# Patient Record
Sex: Female | Born: 1995 | Race: Black or African American | Hispanic: No | Marital: Single | Smoking: Former smoker
Health system: Southern US, Community
[De-identification: ages and names within clinical notes are randomized; demographics above are authoritative.]

## PROBLEM LIST (undated history)

## (undated) ENCOUNTER — Inpatient Hospital Stay (HOSPITAL_COMMUNITY): Payer: Self-pay

## (undated) DIAGNOSIS — Z789 Other specified health status: Secondary | ICD-10-CM

## (undated) HISTORY — PX: NO PAST SURGERIES: SHX2092

---
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
## 2017-04-25 ENCOUNTER — Emergency Department (HOSPITAL_COMMUNITY)
Admission: EM | Admit: 2017-04-25 | Discharge: 2017-04-25 | Disposition: A | Discharge status: Home / Self Care | Payer: Medicaid Other | Attending: Emergency Medicine | Admitting: Emergency Medicine

## 2017-04-25 ENCOUNTER — Encounter (HOSPITAL_COMMUNITY): Payer: Self-pay | Admitting: *Deleted

## 2017-04-25 DIAGNOSIS — Z87891 Personal history of nicotine dependence: Secondary | ICD-10-CM | POA: Insufficient documentation

## 2017-04-25 DIAGNOSIS — R55 Syncope and collapse: Secondary | ICD-10-CM | POA: Insufficient documentation

## 2017-04-25 DIAGNOSIS — Z79899 Other long term (current) drug therapy: Secondary | ICD-10-CM | POA: Insufficient documentation

## 2017-04-25 LAB — URINALYSIS, ROUTINE W REFLEX MICROSCOPIC
BACTERIA UA: NONE SEEN
BILIRUBIN URINE: NEGATIVE
Glucose, UA: NEGATIVE mg/dL
Hgb urine dipstick: NEGATIVE
Ketones, ur: NEGATIVE mg/dL
Nitrite: NEGATIVE
PROTEIN: NEGATIVE mg/dL
Specific Gravity, Urine: 1.024 (ref 1.005–1.030)
pH: 6 (ref 5.0–8.0)

## 2017-04-25 LAB — RAPID URINE DRUG SCREEN, HOSP PERFORMED
Amphetamines: NOT DETECTED
BARBITURATES: NOT DETECTED
Benzodiazepines: NOT DETECTED
COCAINE: NOT DETECTED
OPIATES: NOT DETECTED
Tetrahydrocannabinol: POSITIVE — AB

## 2017-04-25 LAB — PREGNANCY, URINE: Preg Test, Ur: NEGATIVE

## 2017-04-25 LAB — CBG MONITORING, ED: Glucose-Capillary: 100 mg/dL — ABNORMAL HIGH (ref 65–99)

## 2017-04-25 NOTE — ED Triage Notes (Signed)
Pt had near syncopal episode yesterday at work, this morning feels a little light headed and has pain in her rt lower abd and legs. Pt alert w/o s/s of pain or distress during triage.

## 2017-04-25 NOTE — ED Notes (Signed)
Lab notified of add on for urine.

## 2017-04-25 NOTE — ED Notes (Signed)
ED Provider at bedside. 

## 2017-04-25 NOTE — ED Provider Notes (Addendum)
[facility] Provider Note   CSN: [id number] Arrival date & time: 04/25/17  7829     History   Chief Complaint Chief Complaint  Patient presents with  . Light Headed    HPI Erica Vargas is a 21 y.o. female.  HPI Patient presents after syncopal episode yesterday. States that yesterday she was at work and registered began to feel lightheaded and passed out. States last for a couple minutes. Patient now down. No chest pain or trouble breathing. No confusion. Did not feel her heart racing and going slow. She is otherwise healthy. States she had home. This morning feels a little lightheaded and in her right upper abdomen has a little bit of pain. No fevers. No dysuria. Denies pregnancy. States she did however miss a month of her period but this menses at the beginning of this month was normal. No dysuria. No fevers or chills. No nausea vomiting or diarrhea. States she does feel little generally weak.   History reviewed. No pertinent past medical history.  There are no active problems to display for this patient.   History reviewed. No pertinent surgical history.  OB History    Gravida Para Term Preterm AB Living   0 0 0 0 0     SAB TAB Ectopic Multiple Live Births   0 0 0           Home Medications    Prior to Admission medications   Medication Sig Start Date End Date Taking? Authorizing Provider  ibuprofen (ADVIL,MOTRIN) 200 MG tablet Take 400 mg by mouth every 6 (six) hours as needed (headache, pain).   Yes [provider]  Menthol-Camphor (ICY HOT ADVANCED RELIEF) 16-11 % CREA Apply 1 application topically as needed (pain).   Yes [provider]  amoxicillin (AMOXIL) 875 MG tablet Take 1 tablet (875 mg total) by mouth 2 (two) times daily. Patient not taking: Reported on 04/25/2017 12/05/16   Elvina Sidle, MD  famotidine (PEPCID) 20 MG tablet Take 1 tablet (20 mg total) by mouth 2 (two) times daily. Patient not taking: Reported on 04/25/2017  06/27/16   Trixie Dredge, PA-C    Family History No family history on file.  Social History Social History  Substance Use Topics  . Smoking status: Former Smoker    Types: Cigars  . Smokeless tobacco: Never Used  . Alcohol use No     Allergies   Naproxen   Review of Systems Review of Systems  Constitutional: Negative for appetite change.  HENT: Negative for congestion.   Respiratory: Negative for chest tightness.   Cardiovascular: Negative for chest pain.  Gastrointestinal: Positive for abdominal pain.  Endocrine: Negative for polyuria.  Genitourinary: Positive for flank pain.  Musculoskeletal: Negative for back pain.  Neurological: Positive for syncope and light-headedness.  Hematological: Negative for adenopathy.     Physical Exam Updated Vital Signs BP 106/67   Pulse (!) 54   Temp 99 F (37.2 C) (Oral)   Resp 16   Ht  (1.651 m)   Wt 135 lb (61.2 kg)   LMP 03/31/2017 (Approximate)   SpO2 100%   BMI 22.47 kg/m   Physical Exam  Constitutional: She appears well-developed.  HENT:  Head: Atraumatic.  Neck: Neck supple.  Cardiovascular: Normal rate.   No murmur heard. Pulmonary/Chest: Effort normal.  Abdominal: Soft.  Slight tenderness to right upper quadrant/right flank area. No rash. No rebound or guarding.  Musculoskeletal: She exhibits no edema.  Neurological: She is alert.  Skin:  Skin is warm. Capillary refill takes less than 2 seconds.  Psychiatric: She has a normal mood and affect.     ED Treatments / Results  Labs (all labs ordered are listed, but only abnormal results are displayed) Labs Reviewed  URINALYSIS, ROUTINE W REFLEX MICROSCOPIC - Abnormal; Notable for the following:       Result Value   APPearance HAZY (*)    Leukocytes, UA TRACE (*)    Squamous Epithelial / LPF 6-30 (*)    All other components within normal limits  RAPID URINE DRUG SCREEN, HOSP PERFORMED - Abnormal; Notable for the following:    Tetrahydrocannabinol  POSITIVE (*)    All other components within normal limits  CBG MONITORING, ED - Abnormal; Notable for the following:    Glucose-Capillary 100 (*)    All other components within normal limits  PREGNANCY, URINE    EKG  EKG Interpretation  Date/Time:  Friday April 25 2017 10:52:14 EDT Ventricular Rate:  57 PR Interval:  154 QRS Duration: 81 QT Interval:  378 QTC Calculation: 368 R Axis:   74 Text Interpretation:  Sinus bradycardia Otherwise normal ECG Confirmed by SPECTOR MD, ZEBULON ([id number]) on 04/25/2017 3:26:37 PM       Radiology No results found.  Procedures Procedures (including critical care time)  Medications Ordered in ED Medications - No data to display   Initial Impression / Assessment and Plan / ED Course  I have reviewed the triage vital signs and the nursing notes.  Pertinent labs & imaging results that were available during my care of the patient were reviewed by me and considered in my medical decision making (see chart for details).     Patient with syncopal episode. Little lightheaded. Not orthostatic. Urine reassuring. Not pregnant. EKG reassuring.  Final Clinical Impressions(s) / ED Diagnoses   Final diagnoses:  Syncope, unspecified syncope type    New Prescriptions Discharge Medication List as of 04/25/2017 12:25 PM       Benjiman Core, MD 04/25/17 1624    Benjiman Core, MD 05/07/17 [phone]

## 2017-05-29 ENCOUNTER — Encounter (HOSPITAL_COMMUNITY): Payer: Self-pay | Admitting: Emergency Medicine

## 2017-05-29 ENCOUNTER — Ambulatory Visit (HOSPITAL_COMMUNITY)
Admission: EM | Admit: 2017-05-29 | Discharge: 2017-05-29 | Disposition: A | Discharge status: Home / Self Care | Payer: Medicaid Other | Attending: Internal Medicine | Admitting: Internal Medicine

## 2017-05-29 ENCOUNTER — Ambulatory Visit (HOSPITAL_COMMUNITY)
Admission: EM | Admit: 2017-05-29 | Discharge: 2017-05-29 | Discharge status: Against Medical Advice | Payer: Medicaid Other

## 2017-05-29 DIAGNOSIS — H60331 Swimmer's ear, right ear: Secondary | ICD-10-CM | POA: Diagnosis not present

## 2017-05-29 DIAGNOSIS — H6121 Impacted cerumen, right ear: Secondary | ICD-10-CM

## 2017-05-29 MED ORDER — NEOMYCIN-POLYMYXIN-HC 3.5-10000-1 OT SUSP: 4.0000 [drp] | Freq: Three times a day (TID) | OTIC | 0 refills | Status: DC

## 2017-05-29 NOTE — ED Provider Notes (Signed)
CSN: [id number]     Arrival date & time 05/29/17  1621 History   None    Chief Complaint  Patient presents with  . Otalgia   (Consider location/radiation/quality/duration/timing/severity/associated sxs/prior Treatment) Patient c/o right ear discomfort.   The history is provided by the patient.  Otalgia  Location:  Right Behind ear:  No abnormality Quality:  Aching Severity:  Mild Onset quality:  Sudden Duration:  1 week Timing:  Constant Chronicity:  New Relieved by:  Nothing Worsened by:  Nothing   History reviewed. No pertinent past medical history. History reviewed. No pertinent surgical history. No family history on file. Social History  Substance Use Topics  . Smoking status: Former Smoker    Types: Cigars  . Smokeless tobacco: Never Used  . Alcohol use No   OB History    Gravida Para Term Preterm AB Living   0 0 0 0 0     SAB TAB Ectopic Multiple Live Births   0 0 0         Review of Systems  Constitutional: Negative.   HENT: Positive for ear pain.   Eyes: Negative.   Respiratory: Negative.   Cardiovascular: Negative.   Gastrointestinal: Negative.   Endocrine: Negative.   Genitourinary: Negative.   Musculoskeletal: Negative.   Allergic/Immunologic: Negative.   Neurological: Negative.   Hematological: Negative.   Psychiatric/Behavioral: Negative.     Allergies  Naproxen  Home Medications   Prior to Admission medications   Medication Sig Start Date End Date Taking? Authorizing Provider  amoxicillin (AMOXIL) 875 MG tablet Take 1 tablet (875 mg total) by mouth 2 (two) times daily. Patient not taking: Reported on 04/25/2017 12/05/16   Elvina Sidle, MD  famotidine (PEPCID) 20 MG tablet Take 1 tablet (20 mg total) by mouth 2 (two) times daily. Patient not taking: Reported on 04/25/2017 06/27/16   Trixie Dredge, PA-C  ibuprofen (ADVIL,MOTRIN) 200 MG tablet Take 400 mg by mouth every 6 (six) hours as needed (headache, pain).    [provider]   Menthol-Camphor (ICY HOT ADVANCED RELIEF) 16-11 % CREA Apply 1 application topically as needed (pain).    [provider]  neomycin-polymyxin-hydrocortisone (CORTISPORIN) 3.5-10000-1 otic suspension Place 4 drops into the right ear 3 (three) times daily. 05/29/17   Deatra Canter, FNP   Meds Ordered and Administered this Visit  Medications - No data to display  BP 98/71 (BP Location: Left Arm)   Pulse (!) 55   Temp 98.8 F (37.1 C) (Oral)   Resp 16   LMP 04/28/2017   SpO2 99%  No data found.   Physical Exam  Constitutional: She appears well-developed and well-nourished.  HENT:  Head: Normocephalic and atraumatic.  Left Ear: External ear normal.  Mouth/Throat: Oropharynx is clear and moist.  Right cerumen impaction  Eyes: Conjunctivae and EOM are normal. Pupils are equal, round, and reactive to light.  Neck: Normal range of motion. Neck supple.  Cardiovascular: Normal rate, regular rhythm and normal heart sounds.   Pulmonary/Chest: Effort normal and breath sounds normal.  Abdominal: Soft. Bowel sounds are normal.  Nursing note and vitals reviewed.   Urgent Care Course     Procedures (including critical care time)  Labs Review Labs Reviewed - No data to display  Imaging Review No results found.   Visual Acuity Review  Right Eye Distance:   Left Eye Distance:   Bilateral Distance:    Right Eye Near:   Left Eye Near:    Bilateral Near:  MDM   1. Acute swimmer's ear of right side   2. Impacted cerumen of right ear    Flush right ear Cortisporin otic ear gtt's      Deatra CanterOxford, Lagretta Loseke J, FNP 05/29/17 1711

## 2017-05-29 NOTE — ED Triage Notes (Signed)
Right ear pain that started Sunday 5/27.  Reports hearing is fading in and out and hearing gone since yesterday.  Has used peroxide in ear

## 2017-07-23 ENCOUNTER — Inpatient Hospital Stay (HOSPITAL_COMMUNITY): Payer: Medicaid Other

## 2017-07-23 ENCOUNTER — Encounter (HOSPITAL_COMMUNITY): Payer: Self-pay | Admitting: *Deleted

## 2017-07-23 ENCOUNTER — Inpatient Hospital Stay (HOSPITAL_COMMUNITY)
Admission: AD | Admit: 2017-07-23 | Discharge: 2017-07-23 | Disposition: A | Discharge status: Home / Self Care | Payer: Medicaid Other | Source: Ambulatory Visit | Attending: Obstetrics & Gynecology | Admitting: Obstetrics & Gynecology

## 2017-07-23 DIAGNOSIS — O4691 Antepartum hemorrhage, unspecified, first trimester: Secondary | ICD-10-CM | POA: Insufficient documentation

## 2017-07-23 DIAGNOSIS — O98311 Other infections with a predominantly sexual mode of transmission complicating pregnancy, first trimester: Secondary | ICD-10-CM | POA: Diagnosis not present

## 2017-07-23 DIAGNOSIS — Z87891 Personal history of nicotine dependence: Secondary | ICD-10-CM | POA: Diagnosis not present

## 2017-07-23 DIAGNOSIS — A5901 Trichomonal vulvovaginitis: Secondary | ICD-10-CM | POA: Insufficient documentation

## 2017-07-23 DIAGNOSIS — O3680X0 Pregnancy with inconclusive fetal viability, not applicable or unspecified: Secondary | ICD-10-CM

## 2017-07-23 DIAGNOSIS — Z3A01 Less than 8 weeks gestation of pregnancy: Secondary | ICD-10-CM | POA: Insufficient documentation

## 2017-07-23 DIAGNOSIS — O209 Hemorrhage in early pregnancy, unspecified: Secondary | ICD-10-CM | POA: Diagnosis not present

## 2017-07-23 HISTORY — DX: Other specified health status: Z78.9

## 2017-07-23 LAB — CBC
HEMATOCRIT: 38.8 % (ref 36.0–46.0)
HEMOGLOBIN: 13.8 g/dL (ref 12.0–15.0)
MCH: 29.3 pg (ref 26.0–34.0)
MCHC: 35.6 g/dL (ref 30.0–36.0)
MCV: 82.4 fL (ref 78.0–100.0)
Platelets: 239 10*3/uL (ref 150–400)
RBC: 4.71 MIL/uL (ref 3.87–5.11)
RDW: 14.7 % (ref 11.5–15.5)
WBC: 8.4 10*3/uL (ref 4.0–10.5)

## 2017-07-23 LAB — WET PREP, GENITAL
CLUE CELLS WET PREP: NONE SEEN
Sperm: NONE SEEN
Yeast Wet Prep HPF POC: NONE SEEN

## 2017-07-23 LAB — HCG, QUANTITATIVE, PREGNANCY: HCG, BETA CHAIN, QUANT, S: 520 m[IU]/mL — AB (ref <5)

## 2017-07-23 LAB — POCT PREGNANCY, URINE: PREG TEST UR: POSITIVE — AB

## 2017-07-23 MED ORDER — METRONIDAZOLE 500 MG PO TABS
2000.0000 mg | ORAL_TABLET | Freq: Once | ORAL | Status: AC
  Administered 2017-07-23: 2000 mg via ORAL
  Filled 2017-07-23: qty 4

## 2017-07-23 NOTE — Discharge Instructions (Signed)
Ectopic Pregnancy °An ectopic pregnancy is when the fertilized egg attaches (implants) outside the uterus. Most ectopic pregnancies occur in one of the tubes where eggs travel from the ovary to the uterus (fallopian tubes), but the implanting can occur in other locations. In rare cases, ectopic pregnancies occur on the ovary, intestine, pelvis, abdomen, or cervix. In an ectopic pregnancy, the fertilized egg does not have the ability to develop into a normal, healthy baby. °A ruptured ectopic pregnancy is one in which tearing or bursting of a fallopian tube causes internal bleeding. Often, there is intense lower abdominal pain, and vaginal bleeding sometimes occurs. Having an ectopic pregnancy can be life-threatening. If this dangerous condition is not treated, it can lead to blood loss, shock, or even death. °What are the causes? °The most common cause of this condition is damage to one of the fallopian tubes. A fallopian tube may be narrowed or blocked, and that keeps the fertilized egg from reaching the uterus. °What increases the risk? °This condition is more likely to develop in women of childbearing age who have different levels of risk. The levels of risk can be divided into three categories. °High risk °· You have gone through infertility treatment. °· You have had an ectopic pregnancy before. °· You have had surgery on the fallopian tubes, or another surgical procedure, such as an abortion. °· You have had surgery to have the fallopian tubes tied (tubal ligation). °· You have problems or diseases of the fallopian tubes. °· You have been exposed to diethylstilbestrol (DES). This medicine was used until 1971, and it had effects on babies whose mothers took the medicine. °· You become pregnant while using an IUD (intrauterine device) for birth control. °Moderate risk °· You have a history of infertility. °· You have had an STI (sexually transmitted infection). °· You have a history of pelvic inflammatory  disease (PID). °· You have scarring from endometriosis. °· You have multiple sexual partners. °· You smoke. °Low risk °· You have had pelvic surgery. °· You use vaginal douches. °· You became sexually active before age 18. °What are the signs or symptoms? °Common symptoms of this condition include normal pregnancy symptoms, such as missing a period, nausea, tiredness, abdominal pain, breast tenderness, and bleeding. However, ectopic pregnancy will have additional symptoms, such as: °· Pain with intercourse. °· Irregular vaginal bleeding or spotting. °· Cramping or pain on one side or in the lower abdomen. °· Fast heartbeat, low blood pressure, and sweating. °· Passing out while having a bowel movement. ° °Symptoms of a ruptured ectopic pregnancy and internal bleeding may include: °· Sudden, severe pain in the abdomen and pelvis. °· Dizziness, weakness, light-headedness, or fainting. °· Pain in the shoulder or neck area. ° °How is this diagnosed? °This condition is diagnosed by: °· A pelvic exam to locate pain or a mass in the abdomen. °· A pregnancy test. This blood test checks for the presence as well as the specific level of pregnancy hormone in the bloodstream. °· Ultrasound. This is performed if a pregnancy test is positive. In this test, a probe is inserted into the vagina. The probe will detect a fetus, possibly in a location other than the uterus. °· Taking a sample of uterus tissue (dilation and curettage, or D&C). °· Surgery to perform a visual exam of the inside of the abdomen using a thin, lighted tube that has a tiny camera on the end (laparoscope). °· Culdocentesis. This procedure involves inserting a needle at the top   of the vagina, behind the uterus. If blood is present in this area, it may indicate that a fallopian tube is torn. ° °How is this treated? °This condition is treated with medicine or surgery. °Medicine °· An injection of a medicine (methotrexate) may be given to cause the pregnancy tissue  to be absorbed. This medicine may save your fallopian tube. It may be given if: °? The diagnosis is made early, with no signs of active bleeding. °? The fallopian tube has not ruptured. °? You are considered to be a good candidate for the medicine. °Usually, pregnancy hormone blood levels are checked after methotrexate treatment. This is to be sure that the medicine is effective. It may take 4-6 weeks for the pregnancy to be absorbed. Most pregnancies will be absorbed by 3 weeks. °Surgery °· A laparoscope may be used to remove the pregnancy tissue. °· If severe internal bleeding occurs, a larger cut (incision) may be made in the lower abdomen (laparotomy) to remove the fetus and placenta. This is done to stop the bleeding. °· Part or all of the fallopian tube may be removed (salpingectomy) along with the fetus and placenta. The fallopian tube may also be repaired during the surgery. °· In very rare circumstances, removal of the uterus (hysterectomy) may be required. °· After surgery, pregnancy hormone testing may be done to be sure that there is no pregnancy tissue left. °Whether your treatment is medicine or surgery, you may receive a Rho (D) immune globulin shot to prevent problems with any future pregnancy. This shot may be given if: °· You are Rh-negative and the baby's father is Rh-positive. °· You are Rh-negative and you do not know the Rh type of the baby's father. ° °Follow these instructions at home: °· Rest and limit your activity after the procedure for as long as told by your health care provider. °· Until your health care provider says that it is safe: °? Do not lift anything that is heavier than 10 lb (4.5 kg), or the limit that your health care provider tells you. °? Avoid physical exercise and any movement that requires effort (is strenuous). °· To help prevent constipation: °? Eat a healthy diet that includes fruits, vegetables, and whole grains. °? Drink 6-8 glasses of water per day. °Get help  right away if: °· You develop worsening pain that is not relieved by medicine. °· You have: °? A fever or chills. °? Vaginal bleeding. °? Redness and swelling at the incision site. °? Nausea and vomiting. °· You feel dizzy or weak. °· You feel light-headed or you faint. °This information is not intended to replace advice given to you by your health care provider. Make sure you discuss any questions you have with your health care provider. °Document Released: 01/23/2005 Document Revised: 08/14/2016 Document Reviewed: 07/17/2016 °Elsevier Interactive Patient Education © 2018 [employer] ° ° °Trichomoniasis °Trichomoniasis is an STI (sexually transmitted infection) that can affect both women and men. In women, the outer area of the female genitalia (vulva) and the vagina are affected. In men, the penis is mainly affected, but the prostate and other reproductive organs can also be involved. This condition can be treated with medicine. It often has no symptoms (is asymptomatic), especially in men. °What are the causes? °This condition is caused by an organism called Trichomonas vaginalis. Trichomoniasis most often spreads from person to person (is contagious) through sexual contact. °What increases the risk? °The following factors may make you more likely to develop this condition: °·   Having unprotected sexual intercourse. °· Having sexual intercourse with a partner who has trichomoniasis. °· Having multiple sexual partners. °· Having had previous trichomoniasis infections or other STIs. ° °What are the signs or symptoms? °In women, symptoms of trichomoniasis include: °· Abnormal vaginal discharge that is clear, white, gray, or yellow-green and foamy and has an unusual "fishy" odor. °· Itching and irritation of the vagina and vulva. °· Burning or pain during urination or sexual intercourse. °· Genital redness and swelling. ° °In men, symptoms of trichomoniasis include: °· Penile discharge that may be foamy or contain  pus. °· Pain in the penis. This may happen only when urinating. °· Itching or irritation inside the penis. °· Burning after urination or ejaculation. ° °How is this diagnosed? °In women, this condition may be found during a routine Pap test or physical exam. It may be found in men during a routine physical exam. Your health care provider may perform tests to help diagnose this infection, such as: °· Urine tests (men and women). °· The following in women: °? Testing the pH of the vagina. °? A vaginal swab test that checks for the Trichomonas vaginalis organism. °? Testing vaginal secretions. ° °Your health care provider may test you for other STIs, including HIV (human immunodeficiency virus). °How is this treated? °This condition is treated with medicine taken by mouth (orally), such as metronidazole or tinidazole to fight the infection. Your sexual partner(s) may also need to be tested and treated. °· If you are a woman and you plan to become pregnant or think you may be pregnant, tell your health care provider right away. Some medicines that are used to treat the infection should not be taken during pregnancy. ° °Your health care provider may recommend over-the-counter medicines or creams to help relieve itching or irritation. You may be tested for infection again 3 months after treatment. °Follow these instructions at home: °· Take and use over-the-counter and prescription medicines, including creams, only as told by your health care provider. °· Do not have sexual intercourse until one week after you finish your medicine, or until your health care provider approves. Ask your health care provider when you may resume sexual intercourse. °· (Women) Do not douche or wear tampons while you have the infection. °· Discuss your infection with your sexual partner(s). Make sure that your partner gets tested and treated, if necessary. °· Keep all follow-up visits as told by your health care provider. This is  important. °How is this prevented? °· Use condoms every time you have sex. Using condoms correctly and consistently can help protect against STIs. °· Avoid having multiple sexual partners. °· Talk with your sexual partner about any symptoms that either of you may have, as well as any history of STIs. °· Get tested for STIs and STDs (sexually transmitted diseases) before you have sex. Ask your partner to do the same. °· Do not have sexual contact if you have symptoms of trichomoniasis or another STI. °Contact a health care provider if: °· You still have symptoms after you finish your medicine. °· You develop pain in your abdomen. °· You have pain when you urinate. °· You have bleeding after sexual intercourse. °· You develop a rash. °· You feel nauseous or you vomit. °· You plan to become pregnant or think you may be pregnant. °Summary °· Trichomoniasis is an STI (sexually transmitted infection) that can affect both women and men. °· This condition often has no symptoms (is asymptomatic), especially in men. °·   You should not have sexual intercourse until one week after you finish your medicine, or until your health care provider approves. Ask your health care provider when you may resume sexual intercourse. °· Discuss your infection with your sexual partner. Make sure that your partner gets tested and treated, if necessary. °This information is not intended to replace advice given to you by your health care provider. Make sure you discuss any questions you have with your health care provider. °Document Released: 06/11/2001 Document Revised: 11/08/2016 Document Reviewed: 11/08/2016 °Elsevier Interactive Patient Education © 2017 [employer] ° °

## 2017-07-23 NOTE — MAU Note (Signed)
Urine in the lab  

## 2017-07-23 NOTE — MAU Provider Note (Signed)
History     CSN: [id number]  Arrival date and time: 07/23/17 1621   First Provider Initiated Contact with Patient 07/23/17 1901      Chief Complaint  Patient presents with  . Abdominal Pain   Erica Vargas is a 21 y.o. G1P0 at [redacted]w[redacted]d by [facility] presenting with vaginal bleeding. First bleeding episode was 07/14/2017 through 07/17/2017 but was light flow lasting only 3 days instead of her usual 7 days. She began having bright red bleeding again today. She has no pain now but had lower abdominal cramping 2 days ago. Denies nausea or breast tenderness. Last intercourse was about 1 month ago. Denies irritative vaginal discharge. Never had pelvic. First positive HPT was this morning.    Past Medical History:  Diagnosis Date  . Medical history non-contributory     Past Surgical History:  Procedure Laterality Date  . NO PAST SURGERIES      History reviewed. No pertinent family history.  Social History  Substance Use Topics  . Smoking status: Former Smoker    Types: Cigars  . Smokeless tobacco: Never Used  . Alcohol use No    Allergies:  Allergies  Allergen Reactions  . Naproxen Hives    Prescriptions Prior to Admission  Medication Sig Dispense Refill Last Dose  . amoxicillin (AMOXIL) 875 MG tablet Take 1 tablet (875 mg total) by mouth 2 (two) times daily. (Patient not taking: Reported on 04/25/2017) 20 tablet 0 Completed Course at Unknown time  . famotidine (PEPCID) 20 MG tablet Take 1 tablet (20 mg total) by mouth 2 (two) times daily. (Patient not taking: Reported on 04/25/2017) 30 tablet 0 Completed Course at Unknown time  . ibuprofen (ADVIL,MOTRIN) 200 MG tablet Take 400 mg by mouth every 6 (six) hours as needed (headache, pain).   Unknown at Unknown time  . Menthol-Camphor (ICY HOT ADVANCED RELIEF) 16-11 % CREA Apply 1 application topically as needed (pain).   Unknown at Unknown time  . neomycin-polymyxin-hydrocortisone (CORTISPORIN) 3.5-10000-1 otic suspension Place 4 drops  into the right ear 3 (three) times daily. 10 mL 0     Review of Systems  Constitutional: Negative for fatigue and fever.  Gastrointestinal: Negative for abdominal pain, constipation, diarrhea, nausea and vomiting.  Genitourinary: Positive for vaginal bleeding. Negative for dysuria, frequency, hematuria, pelvic pain, vaginal discharge and vaginal pain.  Musculoskeletal: Negative for back pain.  Psychiatric/Behavioral: The patient is nervous/anxious.    Physical Exam   Blood pressure 93/72, pulse 82, temperature 98.7 F (37.1 C), resp. rate 16, weight 126 lb (57.2 kg), last menstrual period 06/10/2017, unknown if currently breastfeeding.  Physical Exam  Nursing note and vitals reviewed. Constitutional: She is oriented to person, place, and time. She appears well-developed and well-nourished. No distress.  HENT:  Head: Normocephalic.  Eyes: Pupils are equal, round, and reactive to light.  Neck: No thyromegaly present.  Cardiovascular: Normal rate.   Respiratory: Effort normal.  GI: Soft. There is no tenderness.  Genitourinary: No vaginal discharge found.  Genitourinary Comments: Pelvic exam NEFG; vagina with small amount dark red blood swabbed from vault and cervix, minimal active bleeding; cervix closed, no lesion; uterus retroverted and nontender; no adnexal tenderness or masses.   Musculoskeletal: Normal range of motion.  Neurological: She is alert and oriented to person, place, and time.  Skin: Skin is warm and dry.  Psychiatric: She has a normal mood and affect. Her behavior is normal.    MAU Course  Procedures Results for orders placed or performed during the  hospital encounter of 07/23/17 (from the past 24 hour(s))  Pregnancy, urine POC     Status: Abnormal   Collection Time: 07/23/17  5:39 PM  Result Value Ref Range   Preg Test, Ur POSITIVE (A) NEGATIVE   Results for orders placed or performed during the hospital encounter of 07/23/17 (from the past 24 hour(s))   Pregnancy, urine POC     Status: Abnormal   Collection Time: 07/23/17  5:39 PM  Result Value Ref Range   Preg Test, Ur POSITIVE (A) NEGATIVE  Wet prep, genital     Status: Abnormal   Collection Time: 07/23/17  7:11 PM  Result Value Ref Range   Yeast Wet Prep HPF POC NONE SEEN NONE SEEN   Trich, Wet Prep PRESENT (A) NONE SEEN   Clue Cells Wet Prep HPF POC NONE SEEN NONE SEEN   WBC, Wet Prep HPF POC FEW (A) NONE SEEN   Sperm NONE SEEN   CBC     Status: None   Collection Time: 07/23/17  7:29 PM  Result Value Ref Range   WBC 8.4 4.0 - 10.5 K/uL   RBC 4.71 3.87 - 5.11 MIL/uL   Hemoglobin 13.8 12.0 - 15.0 g/dL   HCT 16.138.8 09.636.0 - 04.546.0 %   MCV 82.4 78.0 - 100.0 fL   MCH 29.3 26.0 - 34.0 pg   MCHC 35.6 30.0 - 36.0 g/dL   RDW 40.914.7 81.111.5 - 91.415.5 %   Platelets 239 150 - 400 K/uL  ABO/Rh     Status: None (Preliminary result)   Collection Time: 07/23/17  7:29 PM  Result Value Ref Range   ABO/RH(D) B POS   hCG, quantitative, pregnancy     Status: Abnormal   Collection Time: 07/23/17  7:29 PM  Result Value Ref Range   hCG, Beta Chain, Quant, S 520 (H) <5 mIU/mL   [place] Ob Comp Less 14 Wks  Result Date: 07/23/2017 CLINICAL DATA:  Vaginal bleeding with positive pregnancy test EXAM: OBSTETRIC <14 WK US AND TRANSVAGINAL OB US TECHNIQUE: Both transabdominal and transvaginal ultrasound examinations were performed for complete evaluation of the gestation as well as the maternal uterus, adnexal regions, and pelvic cul-de-sac. Transvaginal technique was performed to assess early pregnancy. COMPARISON:  None. FINDINGS: Intrauterine gestational sac: Not visualized Yolk sac:  Not visualized Embryo:  Not visualized Subchorionic hemorrhage:  None visualized. Maternal uterus/adnexae: Ovaries are within normal limits. Right ovary measures 3.1 x 1.8 x 2.9 cm. Left ovary measures 3 x 2 x 2.2 cm. Trace free fluid. IMPRESSION: 1. No intrauterine gestation is visualized. Findings compatible with pregnancy of unknown  location, differential of which includes IUP too early to be seen, recent failed pregnancy, or occult ectopic pregnancy. Suggest trending of HCG with repeat ultrasound as indicated 2. Trace free fluid in the pelvis Electronically Signed   By: Jasmine PangKim  Fujinaga M.D.   On: 07/23/2017 21:19   [place] Ob Transvaginal  Result Date: 07/23/2017 CLINICAL DATA:  Vaginal bleeding with positive pregnancy test EXAM: OBSTETRIC <14 WK US AND TRANSVAGINAL OB US TECHNIQUE: Both transabdominal and transvaginal ultrasound examinations were performed for complete evaluation of the gestation as well as the maternal uterus, adnexal regions, and pelvic cul-de-sac. Transvaginal technique was performed to assess early pregnancy. COMPARISON:  None. FINDINGS: Intrauterine gestational sac: Not visualized Yolk sac:  Not visualized Embryo:  Not visualized Subchorionic hemorrhage:  None visualized. Maternal uterus/adnexae: Ovaries are within normal limits. Right ovary measures 3.1 x 1.8 x 2.9 cm. Left ovary measures 3  x 2 x 2.2 cm. Trace free fluid. IMPRESSION: 1. No intrauterine gestation is visualized. Findings compatible with pregnancy of unknown location, differential of which includes IUP too early to be seen, recent failed pregnancy, or occult ectopic pregnancy. Suggest trending of HCG with repeat ultrasound as indicated 2. Trace free fluid in the pelvis Electronically Signed   By: Jasmine PangKim  Fujinaga M.D.   On: 07/23/2017 21:19    GC/CT sent  Care assumed by Thressa ShellerHeather Hogan, CNM at [address], Deirdre C, CNM 07/23/2017 8:53 PM  Treated in MAU with 2g flagyl PO x 1  Assessment and Plan   1. Trichomonal vaginitis   2. Vaginal bleeding in pregnancy, first trimester   3. Pregnancy, location unknown    DC home Discussed partner treatment  Comfort measures reviewed  1st Trimester precautions  Bleeding precautions Ectopic precautions RX: none  Return to MAU as needed Return to MAU Saturday morning (48 hours) for repeat HCG   Follow-up  Information    [facility] Follow up.   Why:  Saturday for repeat blood work  Contact information: [address] [id number] [place] [phone]

## 2017-07-23 NOTE — MAU Note (Signed)
Pt presents to MAU with complaints of a home pregnancy test this morning. Reports scant amount of vaginal bleeding. No pain at present. Mild cramping 2 days ago

## 2017-07-23 NOTE — Progress Notes (Addendum)
Assumed care of pt. Pt back from U/S.   Flagyl PO given per order.  2120: Discharge instructions given with pt understanding. Pt left unit via ambulatory.

## 2017-07-24 LAB — HIV ANTIBODY (ROUTINE TESTING W REFLEX): HIV SCREEN 4TH GENERATION: NONREACTIVE

## 2017-07-24 LAB — ABO/RH: ABO/RH(D): B POS

## 2017-07-25 LAB — GC/CHLAMYDIA PROBE AMP (~~LOC~~) NOT AT ARMC
Chlamydia: NEGATIVE
Neisseria Gonorrhea: NEGATIVE

## 2017-07-26 ENCOUNTER — Inpatient Hospital Stay (HOSPITAL_COMMUNITY)
Admission: AD | Admit: 2017-07-26 | Discharge: 2017-07-26 | Disposition: A | Discharge status: Home / Self Care | Payer: Medicaid Other | Source: Ambulatory Visit | Attending: Obstetrics and Gynecology | Admitting: Obstetrics and Gynecology

## 2017-07-26 DIAGNOSIS — Z3A01 Less than 8 weeks gestation of pregnancy: Secondary | ICD-10-CM | POA: Insufficient documentation

## 2017-07-26 DIAGNOSIS — R109 Unspecified abdominal pain: Secondary | ICD-10-CM

## 2017-07-26 DIAGNOSIS — Z3401 Encounter for supervision of normal first pregnancy, first trimester: Secondary | ICD-10-CM | POA: Insufficient documentation

## 2017-07-26 DIAGNOSIS — O26891 Other specified pregnancy related conditions, first trimester: Secondary | ICD-10-CM | POA: Diagnosis not present

## 2017-07-26 LAB — HCG, QUANTITATIVE, PREGNANCY: hCG, Beta Chain, Quant, S: 1022 m[IU]/mL — ABNORMAL HIGH (ref <5)

## 2017-07-26 NOTE — Discharge Instructions (Signed)
First Trimester of Pregnancy The first trimester of pregnancy is from week 1 until the end of week 13 (months 1 through 3). During this time, your baby will begin to develop inside you. At 6-8 weeks, the eyes and face are formed, and the heartbeat can be seen on ultrasound. At the end of 12 weeks, all the baby's organs are formed. Prenatal care is all the medical care you receive before the birth of your baby. Make sure you get good prenatal care and follow all of your doctor's instructions. Follow these instructions at home: Medicines  Take over-the-counter and prescription medicines only as told by your doctor. Some medicines are safe and some medicines are not safe during pregnancy.  Take a prenatal vitamin that contains at least 600 micrograms (mcg) of folic acid.  If you have trouble pooping (constipation), take medicine that will make your stool soft (stool softener) if your doctor approves. Eating and drinking  Eat regular, healthy meals.  Your doctor will tell you the amount of weight gain that is right for you.  Avoid raw meat and uncooked cheese.  If you feel sick to your stomach (nauseous) or throw up (vomit): ? Eat 4 or 5 small meals a day instead of 3 large meals. ? Try eating a few soda crackers. ? Drink liquids between meals instead of during meals.  To prevent constipation: ? Eat foods that are high in fiber, like fresh fruits and vegetables, whole grains, and beans. ? Drink enough fluids to keep your pee (urine) clear or pale yellow. Activity  Exercise only as told by your doctor. Stop exercising if you have cramps or pain in your lower belly (abdomen) or low back.  Do not exercise if it is too hot, too humid, or if you are in a place of great height (high altitude).  Try to avoid standing for long periods of time. Move your legs often if you must stand in one place for a long time.  Avoid heavy lifting.  Wear low-heeled shoes. Sit and stand up straight.  You  can have sex unless your doctor tells you not to. Relieving pain and discomfort  Wear a good support bra if your breasts are sore.  Take warm water baths (sitz baths) to soothe pain or discomfort caused by hemorrhoids. Use hemorrhoid cream if your doctor says it is okay.  Rest with your legs raised if you have leg cramps or low back pain.  If you have puffy, bulging veins (varicose veins) in your legs: ? Wear support hose or compression stockings as told by your doctor. ? Raise (elevate) your feet for 15 minutes, 3-4 times a day. ? Limit salt in your food. Prenatal care  Schedule your prenatal visits by the twelfth week of pregnancy.  Write down your questions. Take them to your prenatal visits.  Keep all your prenatal visits as told by your doctor. This is important. Safety  Wear your seat belt at all times when driving.  Make a list of emergency phone numbers. The list should include numbers for family, friends, the hospital, and police and fire departments. General instructions  Ask your doctor for a referral to a local prenatal class. Begin classes no later than at the start of month 6 of your pregnancy.  Ask for help if you need counseling or if you need help with nutrition. Your doctor can give you advice or tell you where to go for help.  Do not use hot tubs, steam rooms, or   saunas.  Do not douche or use tampons or scented sanitary pads.  Do not cross your legs for long periods of time.  Avoid all herbs and alcohol. Avoid drugs that are not approved by your doctor.  Do not use any tobacco products, including cigarettes, chewing tobacco, and electronic cigarettes. If you need help quitting, ask your doctor. You may get counseling or other support to help you quit.  Avoid cat litter boxes and soil used by cats. These carry germs that can cause birth defects in the baby and can cause a loss of your baby (miscarriage) or stillbirth.  Visit your dentist. At home, brush  your teeth with a soft toothbrush. Be gentle when you floss. Contact a doctor if:  You are dizzy.  You have mild cramps or pressure in your lower belly.  You have a nagging pain in your belly area.  You continue to feel sick to your stomach, you throw up, or you have watery poop (diarrhea).  You have a bad smelling fluid coming from your vagina.  You have pain when you pee (urinate).  You have increased puffiness (swelling) in your face, hands, legs, or ankles. Get help right away if:  You have a fever.  You are leaking fluid from your vagina.  You have spotting or bleeding from your vagina.  You have very bad belly cramping or pain.  You gain or lose weight rapidly.  You throw up blood. It may look like coffee grounds.  You are around people who have German measles, fifth disease, or chickenpox.  You have a very bad headache.  You have shortness of breath.  You have any kind of trauma, such as from a fall or a car accident. Summary  The first trimester of pregnancy is from week 1 until the end of week 13 (months 1 through 3).  To take care of yourself and your unborn baby, you will need to eat healthy meals, take medicines only if your doctor tells you to do so, and do activities that are safe for you and your baby.  Keep all follow-up visits as told by your doctor. This is important as your doctor will have to ensure that your baby is healthy and growing well. This information is not intended to replace advice given to you by your health care provider. Make sure you discuss any questions you have with your health care provider. Document Released: 06/03/2008 Document Revised: 12/24/2016 Document Reviewed: 12/24/2016 Elsevier Interactive Patient Education  2017 Elsevier Inc.  

## 2017-07-26 NOTE — MAU Provider Note (Signed)
Ms. Erica Manlyngelica Hagner  is a 21 y.o. G1P0000 at 4510w3d who presents to MAU today for follow-up quant hCG after 48 hours. She denies pain or bleeding today. She was having light bleeding at the time of her last MAU visit. She states that bleeding stopped the following morning and she has not seen any since. She has not required any pain medication. Her quant hCG was 520 on 07/23/17 and US at that time showed no IUGS, adnexa were normal.   Past Medical History:  Diagnosis Date  . Medical history non-contributory     BP 112/74 (BP Location: Right Arm)   Pulse 89   Temp 98.1 F (36.7 C) (Oral)   Resp 16   Ht 5\' 5"  (1.651 m)   Wt 123 lb (55.8 kg)   LMP 06/11/2017   BMI 20.47 kg/m   CONSTITUTIONAL: Well-developed, well-nourished female in no acute distress.  ENT: External right and left ear normal.  EYES: EOM intact, conjunctivae normal.  MUSCULOSKELETAL: Normal range of motion.  CARDIOVASCULAR: Regular heart rate RESPIRATORY: Normal effort NEUROLOGICAL: Alert and oriented to person, place, and time.  SKIN: Skin is warm and dry. No rash noted. Not diaphoretic. No erythema. No pallor. PSYCH: Normal mood and affect. Normal behavior. Normal judgment and thought content.  Results for Erica ManlyBERRY, Opel (MRN [id number]) as of 07/26/2017 10:05  Ref. Range 07/23/2017 19:29 07/23/2017 21:11 07/26/2017 08:56  HCG, Beta Chain, Quant, S Latest Ref Range: <5 mIU/mL 520 (H)  1,022 (H)     A: Pregnancy of unknown location Appropriate rise in quant hCG after 48 hours  P: Discharge home First trimester/ectopic precautions discussed Patient will return for follow-up US in 1 week. Order placed. They will call the patient with an appointment time Patient may return to MAU as needed or if her condition were to change or worsen   Marny LowensteinWenzel, Jaileen Janelle N, PA-C 07/26/2017 10:17 AM

## 2017-07-26 NOTE — MAU Note (Signed)
Pt back for follow up lab work.

## 2017-07-31 ENCOUNTER — Inpatient Hospital Stay (HOSPITAL_COMMUNITY): Payer: Medicaid Other

## 2017-07-31 ENCOUNTER — Inpatient Hospital Stay (HOSPITAL_COMMUNITY)
Admission: AD | Admit: 2017-07-31 | Discharge: 2017-07-31 | Disposition: A | Discharge status: Home / Self Care | Payer: Medicaid Other | Source: Ambulatory Visit | Attending: Obstetrics and Gynecology | Admitting: Obstetrics and Gynecology

## 2017-07-31 ENCOUNTER — Encounter (HOSPITAL_COMMUNITY): Payer: Self-pay | Admitting: *Deleted

## 2017-07-31 DIAGNOSIS — Z87891 Personal history of nicotine dependence: Secondary | ICD-10-CM | POA: Insufficient documentation

## 2017-07-31 DIAGNOSIS — Z3A01 Less than 8 weeks gestation of pregnancy: Secondary | ICD-10-CM | POA: Insufficient documentation

## 2017-07-31 DIAGNOSIS — O209 Hemorrhage in early pregnancy, unspecified: Secondary | ICD-10-CM

## 2017-07-31 DIAGNOSIS — O468X1 Other antepartum hemorrhage, first trimester: Secondary | ICD-10-CM | POA: Diagnosis not present

## 2017-07-31 DIAGNOSIS — O4691 Antepartum hemorrhage, unspecified, first trimester: Secondary | ICD-10-CM | POA: Diagnosis present

## 2017-07-31 DIAGNOSIS — O00101 Right tubal pregnancy without intrauterine pregnancy: Secondary | ICD-10-CM

## 2017-07-31 LAB — URINALYSIS, ROUTINE W REFLEX MICROSCOPIC
BILIRUBIN URINE: NEGATIVE
Bacteria, UA: NONE SEEN
GLUCOSE, UA: NEGATIVE mg/dL
KETONES UR: NEGATIVE mg/dL
Leukocytes, UA: NEGATIVE
NITRITE: NEGATIVE
PH: 6 (ref 5.0–8.0)
Protein, ur: NEGATIVE mg/dL
Specific Gravity, Urine: 1.02 (ref 1.005–1.030)

## 2017-07-31 LAB — CBC
HCT: 33 % — ABNORMAL LOW (ref 36.0–46.0)
HEMOGLOBIN: 11.8 g/dL — AB (ref 12.0–15.0)
MCH: 29.1 pg (ref 26.0–34.0)
MCHC: 35.8 g/dL (ref 30.0–36.0)
MCV: 81.5 fL (ref 78.0–100.0)
PLATELETS: 239 10*3/uL (ref 150–400)
RBC: 4.05 MIL/uL (ref 3.87–5.11)
RDW: 14.5 % (ref 11.5–15.5)
WBC: 7.6 10*3/uL (ref 4.0–10.5)

## 2017-07-31 LAB — BUN: BUN: 9 mg/dL (ref 6–20)

## 2017-07-31 LAB — HCG, QUANTITATIVE, PREGNANCY: HCG, BETA CHAIN, QUANT, S: 1171 m[IU]/mL — AB (ref <5)

## 2017-07-31 LAB — CREATININE, SERUM
Creatinine, Ser: 0.78 mg/dL (ref 0.44–1.00)
GFR calc Af Amer: >60 mL/min (ref >60)

## 2017-07-31 LAB — AST: AST: 15 U/L (ref 15–41)

## 2017-07-31 NOTE — Discharge Instructions (Signed)
Ectopic Pregnancy °An ectopic pregnancy is when the fertilized egg attaches (implants) outside the uterus. Most ectopic pregnancies occur in one of the tubes where eggs travel from the ovary to the uterus (fallopian tubes), but the implanting can occur in other locations. In rare cases, ectopic pregnancies occur on the ovary, intestine, pelvis, abdomen, or cervix. In an ectopic pregnancy, the fertilized egg does not have the ability to develop into a normal, healthy baby. °A ruptured ectopic pregnancy is one in which tearing or bursting of a fallopian tube causes internal bleeding. Often, there is intense lower abdominal pain, and vaginal bleeding sometimes occurs. Having an ectopic pregnancy can be life-threatening. If this dangerous condition is not treated, it can lead to blood loss, shock, or even death. °What are the causes? °The most common cause of this condition is damage to one of the fallopian tubes. A fallopian tube may be narrowed or blocked, and that keeps the fertilized egg from reaching the uterus. °What increases the risk? °This condition is more likely to develop in women of childbearing age who have different levels of risk. The levels of risk can be divided into three categories. °High risk °· You have gone through infertility treatment. °· You have had an ectopic pregnancy before. °· You have had surgery on the fallopian tubes, or another surgical procedure, such as an abortion. °· You have had surgery to have the fallopian tubes tied (tubal ligation). °· You have problems or diseases of the fallopian tubes. °· You have been exposed to diethylstilbestrol (DES). This medicine was used until 1971, and it had effects on babies whose mothers took the medicine. °· You become pregnant while using an IUD (intrauterine device) for birth control. °Moderate risk °· You have a history of infertility. °· You have had an STI (sexually transmitted infection). °· You have a history of pelvic inflammatory  disease (PID). °· You have scarring from endometriosis. °· You have multiple sexual partners. °· You smoke. °Low risk °· You have had pelvic surgery. °· You use vaginal douches. °· You became sexually active before age 18. °What are the signs or symptoms? °Common symptoms of this condition include normal pregnancy symptoms, such as missing a period, nausea, tiredness, abdominal pain, breast tenderness, and bleeding. However, ectopic pregnancy will have additional symptoms, such as: °· Pain with intercourse. °· Irregular vaginal bleeding or spotting. °· Cramping or pain on one side or in the lower abdomen. °· Fast heartbeat, low blood pressure, and sweating. °· Passing out while having a bowel movement. ° °Symptoms of a ruptured ectopic pregnancy and internal bleeding may include: °· Sudden, severe pain in the abdomen and pelvis. °· Dizziness, weakness, light-headedness, or fainting. °· Pain in the shoulder or neck area. ° °How is this diagnosed? °This condition is diagnosed by: °· A pelvic exam to locate pain or a mass in the abdomen. °· A pregnancy test. This blood test checks for the presence as well as the specific level of pregnancy hormone in the bloodstream. °· Ultrasound. This is performed if a pregnancy test is positive. In this test, a probe is inserted into the vagina. The probe will detect a fetus, possibly in a location other than the uterus. °· Taking a sample of uterus tissue (dilation and curettage, or D&C). °· Surgery to perform a visual exam of the inside of the abdomen using a thin, lighted tube that has a tiny camera on the end (laparoscope). °· Culdocentesis. This procedure involves inserting a needle at the top   of the vagina, behind the uterus. If blood is present in this area, it may indicate that a fallopian tube is torn. ° °How is this treated? °This condition is treated with medicine or surgery. °Medicine °· An injection of a medicine (methotrexate) may be given to cause the pregnancy tissue  to be absorbed. This medicine may save your fallopian tube. It may be given if: °? The diagnosis is made early, with no signs of active bleeding. °? The fallopian tube has not ruptured. °? You are considered to be a good candidate for the medicine. °Usually, pregnancy hormone blood levels are checked after methotrexate treatment. This is to be sure that the medicine is effective. It may take 4-6 weeks for the pregnancy to be absorbed. Most pregnancies will be absorbed by 3 weeks. °Surgery °· A laparoscope may be used to remove the pregnancy tissue. °· If severe internal bleeding occurs, a larger cut (incision) may be made in the lower abdomen (laparotomy) to remove the fetus and placenta. This is done to stop the bleeding. °· Part or all of the fallopian tube may be removed (salpingectomy) along with the fetus and placenta. The fallopian tube may also be repaired during the surgery. °· In very rare circumstances, removal of the uterus (hysterectomy) may be required. °· After surgery, pregnancy hormone testing may be done to be sure that there is no pregnancy tissue left. °Whether your treatment is medicine or surgery, you may receive a Rho (D) immune globulin shot to prevent problems with any future pregnancy. This shot may be given if: °· You are Rh-negative and the baby's father is Rh-positive. °· You are Rh-negative and you do not know the Rh type of the baby's father. ° °Follow these instructions at home: °· Rest and limit your activity after the procedure for as long as told by your health care provider. °· Until your health care provider says that it is safe: °? Do not lift anything that is heavier than 10 lb (4.5 kg), or the limit that your health care provider tells you. °? Avoid physical exercise and any movement that requires effort (is strenuous). °· To help prevent constipation: °? Eat a healthy diet that includes fruits, vegetables, and whole grains. °? Drink 6-8 glasses of water per day. °Get help  right away if: °· You develop worsening pain that is not relieved by medicine. °· You have: °? A fever or chills. °? Vaginal bleeding. °? Redness and swelling at the incision site. °? Nausea and vomiting. °· You feel dizzy or weak. °· You feel light-headed or you faint. °This information is not intended to replace advice given to you by your health care provider. Make sure you discuss any questions you have with your health care provider. °Document Released: 01/23/2005 Document Revised: 08/14/2016 Document Reviewed: 07/17/2016 °Elsevier Interactive Patient Education © 2018 Elsevier Inc. ° ° °Methotrexate Treatment for an Ectopic Pregnancy °Methotrexate is a medicine that treats a pregnancy condition in which the fetus develops outside the uterus (ectopic pregnancy) by stopping the growth of the fertilized egg. It also helps your body absorb tissue from the egg. This takes between 2 weeks and 6 weeks. Most ectopic pregnancies can be successfully treated with methotrexate if they are detected early enough. °Tell a health care provider about: °· Any allergies you have. °· All medicines you are taking, including vitamins, herbs, eye drops, creams, and over-the-counter medicines. °· Any medical conditions you have. °What are the risks? °Generally, this is a safe treatment.   However, problems can occur, including: °· Nausea. °· Vomiting. °· Diarrhea. °· Abdominal cramping. °· Mouth sores. °· Increased vaginal bleeding or spotting. °· Swelling or irritation of the lining of your lungs (pneumonitis). °· Failed treatment and continuation of the pregnancy. °· Liver damage. °· Hair loss. ° °There is still a risk of the ectopic pregnancy rupturing while using the methotrexate. °What happens before the procedure? °Before you take the medicine: °· Liver tests, kidney tests, and a complete blood test are performed. °· Blood tests are performed to measure the pregnancy hormone levels and to determine your blood type. °· If you are  Rh-negative and the father is Rh-positive or his Rh type is not known, you will be given a Rho (D) immune globulin shot. ° °What happens during the procedure? °There are two methods that your health care provider may use to give you methotrexate. °· One method involves a single dose or injection of the medicine. °· Another method involves a series of doses given through several injections. ° °What happens after the procedure? °· You may have some abdominal cramping, vaginal bleeding, and fatigue in the first few days after taking methotrexate. °· Blood tests will be taken for several weeks to check the pregnancy hormone levels. The blood tests are performed until there is no more pregnancy hormone detected in the blood. °This information is not intended to replace advice given to you by your health care provider. Make sure you discuss any questions you have with your health care provider. °Document Released: 12/10/2001 Document Revised: 05/23/2016 Document Reviewed: 10/04/2013 °Elsevier Interactive Patient Education © 2017 Elsevier Inc. ° °

## 2017-07-31 NOTE — MAU Note (Signed)
+  vaginal bleeding Bleeding more than usual Light period in nature Red No clots  +lower right abdominal pain Stabbing in nature when happens Intermittent No pain at present

## 2017-07-31 NOTE — MAU Provider Note (Signed)
History     CSN: [id number]  Arrival date and time: 07/31/17 1337  First Provider Initiated Contact with Patient 07/31/17 1421      Chief Complaint  Patient presents with  . Vaginal Bleeding   HPI Erica Vargas is a 21 y.o. G1P0000 at [redacted]w[redacted]d by LMP who presents with vaginal bleeding. Patient seen last week for vaginal bleeding. Had appropriate rising BHCG & outpatient ultrasound was ordered for viability. States vaginal bleeding has increased somewhat & is more like a period now. Not saturating pads & no clots. Also reports RLQ pain that is intermittent & stabbing. Rates pain 0/10 currently. Was treated for trichomonas during MAU visit last week; GC/Ct came back negative. Patient has not had intercourse since that visit.   OB History    Gravida Para Term Preterm AB Living   1 0 0 0 0     SAB TAB Ectopic Multiple Live Births   0 0 0          Past Medical History:  Diagnosis Date  . Medical history non-contributory     Past Surgical History:  Procedure Laterality Date  . NO PAST SURGERIES      History reviewed. No pertinent family history.  Social History  Substance Use Topics  . Smoking status: Former Smoker    Types: Cigars  . Smokeless tobacco: Never Used  . Alcohol use No    Allergies:  Allergies  Allergen Reactions  . Naproxen Hives    Prescriptions Prior to Admission  Medication Sig Dispense Refill Last Dose  . neomycin-polymyxin-hydrocortisone (CORTISPORIN) 3.5-10000-1 otic suspension Place 4 drops into the right ear 3 (three) times daily. 10 mL 0     Review of Systems  Constitutional: Negative.   Gastrointestinal: Positive for abdominal pain (none currently). Negative for constipation, diarrhea, nausea and vomiting.  Genitourinary: Positive for vaginal bleeding. Negative for vaginal discharge.   Physical Exam   Blood pressure (!) 91/49, pulse (!) 54, temperature 98 F (36.7 C), temperature source Oral, resp. rate 16, weight 124 lb 0.6 oz (56.3 kg),  last menstrual period 06/11/2017, SpO2 100 %, unknown if currently breastfeeding.  Physical Exam  Nursing note and vitals reviewed. Constitutional: She is oriented to person, place, and time. She appears well-developed and well-nourished. No distress.  HENT:  Head: Normocephalic and atraumatic.  Eyes: Conjunctivae are normal. Right eye exhibits no discharge. Left eye exhibits no discharge. No scleral icterus.  Neck: Normal range of motion.  Respiratory: Effort normal. No respiratory distress.  GI: Soft. She exhibits no distension. There is tenderness in the right lower quadrant. There is no rigidity and no guarding.  Genitourinary: No bleeding in the vagina.  Neurological: She is alert and oriented to person, place, and time.  Skin: Skin is warm and dry. She is not diaphoretic.  Psychiatric: She has a normal mood and affect. Her behavior is normal. Judgment and thought content normal.    MAU Course  Procedures Results for orders placed or performed during the hospital encounter of 07/31/17 (from the past 24 hour(s))  Urinalysis, Routine w reflex microscopic     Status: Abnormal   Collection Time: 07/31/17  1:46 PM  Result Value Ref Range   Color, Urine YELLOW YELLOW   APPearance CLEAR CLEAR   Specific Gravity, Urine 1.020 1.005 - 1.030   pH 6.0 5.0 - 8.0   Glucose, UA NEGATIVE NEGATIVE mg/dL   Hgb urine dipstick LARGE (A) NEGATIVE   Bilirubin Urine NEGATIVE NEGATIVE   Ketones, ur  NEGATIVE NEGATIVE mg/dL   Protein, ur NEGATIVE NEGATIVE mg/dL   Nitrite NEGATIVE NEGATIVE   Leukocytes, UA NEGATIVE NEGATIVE   RBC / HPF 6-30 0 - 5 RBC/hpf   WBC, UA 0-5 0 - 5 WBC/hpf   Bacteria, UA NONE SEEN NONE SEEN   Squamous Epithelial / LPF 0-5 (A) NONE SEEN   Mucous PRESENT    Hyaline Casts, UA PRESENT   CBC     Status: Abnormal   Collection Time: 07/31/17  2:38 PM  Result Value Ref Range   WBC 7.6 4.0 - 10.5 K/uL   RBC 4.05 3.87 - 5.11 MIL/uL   Hemoglobin 11.8 (L) 12.0 - 15.0 g/dL   HCT  16.133.0 (L) 09.636.0 - 46.0 %   MCV 81.5 78.0 - 100.0 fL   MCH 29.1 26.0 - 34.0 pg   MCHC 35.8 30.0 - 36.0 g/dL   RDW 04.514.5 40.911.5 - 81.115.5 %   Platelets 239 150 - 400 K/uL  hCG, quantitative, pregnancy     Status: Abnormal   Collection Time: 07/31/17  2:38 PM  Result Value Ref Range   hCG, Beta Chain, Quant, S 1,171 (H) <5 mIU/mL  AST     Status: None   Collection Time: 07/31/17  2:38 PM  Result Value Ref Range   AST 15 15 - 41 U/L  BUN     Status: None   Collection Time: 07/31/17  2:38 PM  Result Value Ref Range   BUN 9 6 - 20 mg/dL  Creatinine, serum     Status: None   Collection Time: 07/31/17  2:38 PM  Result Value Ref Range   Creatinine, Ser 0.78 0.44 - 1.00 mg/dL   GFR calc non Af Amer >60 >60 mL/min   GFR calc Af Amer >60 >60 mL/min   [place] Ob Transvaginal  Result Date: 07/31/2017 CLINICAL DATA:  Vaginal bleeding EXAM: OBSTETRIC <14 WK US AND TRANSVAGINAL OB US TECHNIQUE: Both transabdominal and transvaginal ultrasound examinations were performed for complete evaluation of the gestation as well as the maternal uterus, adnexal regions, and pelvic cul-de-sac. Transvaginal technique was performed to assess early pregnancy. COMPARISON:  None. FINDINGS: Intrauterine gestational sac: None visualized Yolk sac:  Not visualized Embryo:  Not visualized Cardiac Activity: Heart Rate:   bpm MSD:   mm    w     d CRL:    mm    w    d                  US EDC: Subchorionic hemorrhage:  None visualized. Maternal uterus/adnexae: There is a 1.7 x 1.4 x 1.2 cm masslike area adjacent to the right ovary concerning for ectopic pregnancy. No left adnexal mass or free fluid. IMPRESSION: Findings concerning for right ectopic pregnancy. Critical Value/emergent results were called by telephone at the time of interpretation on 07/31/2017 at 3:55 pm to [facility] Takeesha Isley , who verbally acknowledged these results. Electronically Signed   By: Charlett NoseKevin  Dover M.D.   On: 07/31/2017 15:56     MDM B positive VSS, NAD BHCG, CBC,  ultrasound Hemoglobin stable at 11.8 BHCG today 1171, inappropriate rise from 1022 (5 days ago) Radiologist called regarding results of ultrasound -- no IUP, no free fluid, right adnexal mass adjacent to ovary suspicious for ectopic pregnancy that was not present last week measuring 1.7x1.4x1.2  Reviewed results of labs & imaging. Offered methotrexate as treatment for ectopic pregnancy. Discussed risks of untreated ectopic pregnancy including emergent surgery, affected fertility, and death.  Patient states she would like to go home to speak with family & return tomorrow for injection.  Reviewed with Dr. Vergie LivingPickens. Patient currently stable. Will give strict return precautions.   Assessment and Plan  A: 1. Right tubal pregnancy without intrauterine pregnancy   2. Vaginal bleeding in pregnancy, first trimester    P: Discharge home Return to MAU tomorrow for labs & MTX Discussed reasons to return to MAU  Provided printed information regarding ectopic pregnancy & MTX treatment of ectopic  Erica Vargas 07/31/2017, 2:18 PM

## 2017-07-31 NOTE — MAU Note (Signed)
Urine in lab 

## 2017-08-01 LAB — CULTURE, OB URINE: CULTURE: NO GROWTH

## 2017-08-02 ENCOUNTER — Inpatient Hospital Stay (HOSPITAL_COMMUNITY)
Admission: AD | Admit: 2017-08-02 | Discharge: 2017-08-02 | Disposition: A | Discharge status: Home / Self Care | Payer: Medicaid Other | Source: Ambulatory Visit | Attending: Obstetrics and Gynecology | Admitting: Obstetrics and Gynecology

## 2017-08-02 DIAGNOSIS — O26891 Other specified pregnancy related conditions, first trimester: Secondary | ICD-10-CM | POA: Insufficient documentation

## 2017-08-02 DIAGNOSIS — Z87891 Personal history of nicotine dependence: Secondary | ICD-10-CM | POA: Insufficient documentation

## 2017-08-02 DIAGNOSIS — O00201 Right ovarian pregnancy without intrauterine pregnancy: Secondary | ICD-10-CM | POA: Diagnosis not present

## 2017-08-02 LAB — HCG, QUANTITATIVE, PREGNANCY: HCG, BETA CHAIN, QUANT, S: 1089 m[IU]/mL — AB (ref <5)

## 2017-08-02 NOTE — MAU Provider Note (Signed)
History   G1 in for repeat quant and MTX for possible ectopic preg. Pt denies any pain at present time.   CSN: [id number]  Arrival date & time 08/02/17  1444   None     Chief Complaint  Patient presents with  . Follow-up    HPI  Past Medical History:  Diagnosis Date  . Medical history non-contributory     Past Surgical History:  Procedure Laterality Date  . NO PAST SURGERIES      No family history on file.  Social History  Substance Use Topics  . Smoking status: Former Smoker    Types: Cigars  . Smokeless tobacco: Never Used  . Alcohol use No    OB History    Gravida Para Term Preterm AB Living   1 0 0 0 0     SAB TAB Ectopic Multiple Live Births   0 0 0          Review of Systems  Constitutional: Negative.   HENT: Negative.   Eyes: Negative.   Respiratory: Negative.   Cardiovascular: Negative.   Gastrointestinal: Negative.   Endocrine: Negative.   Genitourinary: Positive for vaginal bleeding.  Musculoskeletal: Negative.   Skin: Negative.   Allergic/Immunologic: Negative.   Neurological: Negative.   Hematological: Negative.   Psychiatric/Behavioral: Negative.     Allergies  Naproxen  Home Medications    BP 102/74 (BP Location: Right Arm)   Pulse (!) 103   Temp 98.3 F (36.8 C) (Oral)   Resp 16   Ht 5\' 5"  (1.651 m)   Wt 122 lb 1.9 oz (55.4 kg)   LMP 06/11/2017   SpO2 99%   BMI 20.32 kg/m   Physical Exam  Constitutional: She is oriented to person, place, and time. She appears well-developed.  HENT:  Head: Normocephalic.  Neck: Normal range of motion.  Pulmonary/Chest: Effort normal.  Musculoskeletal: Normal range of motion.  Neurological: She is alert and oriented to person, place, and time. She has normal reflexes.  Skin: Skin is warm and dry.  Psychiatric: She has a normal mood and affect. Her behavior is normal. Judgment and thought content normal.    MAU Course  Procedures (including critical care time)  Labs Reviewed   HCG, QUANTITATIVE, PREGNANCY   [place] Ob Transvaginal  Result Date: 07/31/2017 CLINICAL DATA:  Vaginal bleeding EXAM: OBSTETRIC <14 WK US AND TRANSVAGINAL OB US TECHNIQUE: Both transabdominal and transvaginal ultrasound examinations were performed for complete evaluation of the gestation as well as the maternal uterus, adnexal regions, and pelvic cul-de-sac. Transvaginal technique was performed to assess early pregnancy. COMPARISON:  None. FINDINGS: Intrauterine gestational sac: None visualized Yolk sac:  Not visualized Embryo:  Not visualized Cardiac Activity: Heart Rate:   bpm MSD:   mm    w     d CRL:    mm    w    d                  US EDC: Subchorionic hemorrhage:  None visualized. Maternal uterus/adnexae: There is a 1.7 x 1.4 x 1.2 cm masslike area adjacent to the right ovary concerning for ectopic pregnancy. No left adnexal mass or free fluid. IMPRESSION: Findings concerning for right ectopic pregnancy. Critical Value/emergent results were called by telephone at the time of interpretation on 07/31/2017 at 3:55 pm to [facility] , who verbally acknowledged these results. Electronically Signed   By: Charlett NoseKevin  Dover M.D.   On: 07/31/2017 15:56  No diagnosis found.    MDM  After lengthy discussion with pt and careful review of labs and ultrasound report and probable diagnosis of ectopic preg vs failed preg. Pt verbalized understanding but refuses MTX therapy at present time. Is will to come back to clinic Monday for repeat quant. Also verbalized understanding that if she has any abd pain she is to come back to hospital immediately. Pt lab drawn and d/c home

## 2017-08-02 NOTE — MAU Note (Signed)
Patient states she is back for follow up blood work.  Discussed previous visit that states she was due to come back from methotrexate. Patient states she does not want this.  Denies pain. +vaginal bleeding that started again today; brown in color; light period in nature

## 2017-08-04 ENCOUNTER — Ambulatory Visit: Payer: Medicaid Other

## 2017-08-04 ENCOUNTER — Telehealth: Payer: Self-pay

## 2017-08-04 ENCOUNTER — Ambulatory Visit (HOSPITAL_COMMUNITY): Admission: RE | Admit: 2017-08-04 | Payer: Medicaid Other | Source: Ambulatory Visit

## 2017-08-04 NOTE — Telephone Encounter (Signed)
Called pt in regards to missed appt for STAT beta lab.  Was unable to speak to the pt.  Family member picked up the phone and informed me that she had the pt's phone but would not be near the pt until later in the day.  I asked if the pt would be available in the am.  Family member stated that the pt should have the phone in the am.  I explained that someone from the office will call her in the am and that it is important that we reach in regards to this appt.  The family member stated understanding.

## 2017-08-05 NOTE — Telephone Encounter (Signed)
Called patient no answer was unable to leave a message at this time. Will try again later. She has no appointment scheduled in our system.

## 2017-08-07 NOTE — Telephone Encounter (Signed)
Attempted to call patient- no answer or voice mail to leave a message. 

## 2017-08-14 NOTE — Telephone Encounter (Signed)
Called patient no answer left a voice mail for patient to called us back regarding some important blood work. Patient needs to have lab work done for hormone pregnancy level.

## 2017-10-10 ENCOUNTER — Encounter: Payer: Medicaid Other | Admitting: Certified Nurse Midwife

## 2017-10-10 ENCOUNTER — Telehealth: Payer: Self-pay

## 2017-10-10 NOTE — Telephone Encounter (Signed)
Pt called to reschedule appt. This has been done

## 2017-10-27 DIAGNOSIS — Z3400 Encounter for supervision of normal first pregnancy, unspecified trimester: Secondary | ICD-10-CM | POA: Insufficient documentation

## 2017-10-28 ENCOUNTER — Encounter: Payer: Medicaid Other | Admitting: Certified Nurse Midwife

## 2017-12-26 ENCOUNTER — Encounter (HOSPITAL_COMMUNITY): Payer: Self-pay | Admitting: *Deleted

## 2017-12-26 ENCOUNTER — Inpatient Hospital Stay (HOSPITAL_COMMUNITY): Payer: Medicaid Other

## 2017-12-26 ENCOUNTER — Other Ambulatory Visit: Payer: Self-pay

## 2017-12-26 ENCOUNTER — Inpatient Hospital Stay (HOSPITAL_COMMUNITY)
Admission: AD | Admit: 2017-12-26 | Discharge: 2017-12-26 | Disposition: A | Discharge status: Home / Self Care | Payer: Medicaid Other | Source: Ambulatory Visit | Attending: Obstetrics & Gynecology | Admitting: Obstetrics & Gynecology

## 2017-12-26 DIAGNOSIS — O26893 Other specified pregnancy related conditions, third trimester: Secondary | ICD-10-CM | POA: Diagnosis present

## 2017-12-26 DIAGNOSIS — Z3201 Encounter for pregnancy test, result positive: Secondary | ICD-10-CM | POA: Diagnosis not present

## 2017-12-26 DIAGNOSIS — E860 Dehydration: Secondary | ICD-10-CM | POA: Diagnosis not present

## 2017-12-26 DIAGNOSIS — Z3A00 Weeks of gestation of pregnancy not specified: Secondary | ICD-10-CM | POA: Insufficient documentation

## 2017-12-26 DIAGNOSIS — Z87891 Personal history of nicotine dependence: Secondary | ICD-10-CM | POA: Diagnosis not present

## 2017-12-26 DIAGNOSIS — Z3400 Encounter for supervision of normal first pregnancy, unspecified trimester: Secondary | ICD-10-CM

## 2017-12-26 LAB — POCT PREGNANCY, URINE: PREG TEST UR: POSITIVE — AB

## 2017-12-26 LAB — HCG, QUANTITATIVE, PREGNANCY: HCG, BETA CHAIN, QUANT, S: 61 m[IU]/mL — AB (ref <5)

## 2017-12-26 LAB — PREGNANCY, URINE: PREG TEST UR: POSITIVE — AB

## 2017-12-26 NOTE — Progress Notes (Signed)
K. Crisoforo OxfordKooistra, CNM talking to pt regarding POC.

## 2017-12-26 NOTE — MAU Note (Signed)
Urine in lab 

## 2017-12-26 NOTE — MAU Provider Note (Signed)
Patient Erica Vargas is a 21 y.o. G1P0000 at 7541w2d  here who wants a check up because she is [redacted] weeks pregnant and has not seen a provider since October. She is here with her boyfriend.  She denies abdominal pain, bleeding, discharge, NV, dysuria.  She was followed for a pregnancy of unknown location in August but did not keep her appointments. At her last appointment on August 4 she was told she needed repeat lab work for possible ectopic but she did not follow up. See notes from Aug and Oct 2018.  History     CSN: [id number]  Arrival date and time: 12/26/17 [id number]   None     Chief Complaint  Patient presents with  . Dehydrated   HPI Patient is here for a check-up because she says she is [redacted] weeks pregnant. Patient claims to have had a prenatal visit in October at Dr. Thomes LollingHarpers office, where a fetal heart rate was auscultated at 145 bpm.  No visit documenting this appointment can be found in our system, and patient denies using another name.  She denies vaginal bleeding, cramping, NV/ abdominal pain, abnormal discharge.  OB History    Gravida Para Term Preterm AB Living   1 0 0 0 0     SAB TAB Ectopic Multiple Live Births   0 0 0          Past Medical History:  Diagnosis Date  . Medical history non-contributory     Past Surgical History:  Procedure Laterality Date  . NO PAST SURGERIES      Family History  Problem Relation Age of Onset  . Sickle cell anemia Mother     Social History   Tobacco Use  . Smoking status: Former Smoker    Types: Cigars  . Smokeless tobacco: Never Used  Substance Use Topics  . Alcohol use: No  . Drug use: No    Allergies:  Allergies  Allergen Reactions  . Naproxen Hives    Medications Prior to Admission  Medication Sig Dispense Refill Last Dose  . neomycin-polymyxin-hydrocortisone (CORTISPORIN) 3.5-10000-1 otic suspension Place 4 drops into the right ear 3 (three) times daily. 10 mL 0     Review of Systems  HENT: Negative.    Respiratory: Negative.   Cardiovascular: Negative.   Gastrointestinal: Negative.   Genitourinary: Negative.   Musculoskeletal: Negative.   Neurological: Negative.   Psychiatric/Behavioral: Negative.    Physical Exam   Blood pressure 106/83, pulse (!) 108, temperature 98.3 F (36.8 C), temperature source Oral, resp. rate 20, height 5\' 5"  (1.651 m), weight 52.2 kg (115 lb), last menstrual period 06/11/2017, SpO2 98 %, unknown if currently breastfeeding.  Physical Exam  Constitutional: She is oriented to person, place, and time. She appears well-developed and well-nourished.  HENT:  Head: Normocephalic.  Neck: Normal range of motion.  GI: Soft.  Musculoskeletal: Normal range of motion.  Neurological: She is alert and oriented to person, place, and time.  Skin: Skin is warm and dry.  Psychiatric: She has a normal mood and affect.    MAU Course  Procedures  MDM Stat bedside US ordered when unable to detect FHR with monitors. Dr.Davis at the bedside immediately along with radiology. Beside US shows a non-gravid uterus.  -beta hcg is 61; UPT is faintly positive.   Plan of care discussed with Erin FullingHarraway Smith. It is possible that the beta hcg is elevated from a new pregnancy, Patient to return to clinic in one week for repeat bchg.  At that point, determine if patient needs another US or repeat bhcg.     PAtient denies having seen any other health care providers or having had a termination. Says she has had no bleeding or any signs of a miscarriage. She is very confused by the fact that she is not [redacted] weeks pregnant. She is unable to say why she hasn't come for any prenatal visits, other than to say "I had my prenatal vitamins so I just didn't come in". At times ignored this CNM's attempts to discuss the situation  with her and instead focused on texting on her cell phone.  Assessment and Plan   1. Pregnancy test positive   2. Supervision of normal first pregnancy, antepartum    2..  Discussed with patient that we cannot rule out ectopic pregnancy vs. New pregnancy vs. Past pregnancy.  3. Patient will return to clinic in one week for a repeat bchg (patient should wait for results). At that point, it may be necessary to order a follow-up outpatient US.  4. Strict ectopic precautions given.   Charlesetta GaribaldiKathryn Lorraine Ramiro Pangilinan 12/26/2017, 1:53 PM

## 2017-12-26 NOTE — Discharge Instructions (Signed)
Ectopic Pregnancy  An ectopic pregnancy happens when a fertilized egg grows outside the uterus. A pregnancy cannot live outside of the uterus. This problem often happens in the fallopian tube. It is often caused by damage to the fallopian tube.  If this problem is found early, you may be treated with medicine. If your tube tears or bursts open (ruptures), you will bleed inside. This is an emergency. You will need surgery. Get help right away.  What are the signs or symptoms?  You may have normal pregnancy symptoms at first. These include:  · Missing your period.  · Feeling sick to your stomach (nauseous).  · Being tired.  · Having tender breasts.    Then, you may start to have symptoms that are not normal. These include:  · Pain with sex (intercourse).  · Bleeding from the vagina. This includes light bleeding (spotting).  · Belly (abdomen) or lower belly cramping or pain. This may be felt on one side.  · A fast heartbeat (pulse).  · Passing out (fainting) after going poop (bowel movement).    If your tube tears, you may have symptoms such as:  · Really bad pain in the belly or lower belly. This happens suddenly.  · Dizziness.  · Passing out.  · Shoulder pain.    Get help right away if:  You have any of these symptoms. This is an emergency.  This information is not intended to replace advice given to you by your health care provider. Make sure you discuss any questions you have with your health care provider.  Document Released: 03/14/2009 Document Revised: 05/23/2016 Document Reviewed: 07/28/2013  Elsevier Interactive Patient Education © 2017 Elsevier Inc[employer]        First Trimester of Pregnancy  The first trimester of pregnancy is from week 1 until the end of week 13 (months 1 through 3). During this time, your baby will begin to develop inside you. At 6-8 weeks, the eyes and face are formed, and the heartbeat can be seen on ultrasound. At the end of 12 weeks, all the baby's organs are formed. Prenatal care is all the  medical care you receive before the birth of your baby. Make sure you get good prenatal care and follow all of your doctor's instructions.  Follow these instructions at home:  Medicines  · Take over-the-counter and prescription medicines only as told by your doctor. Some medicines are safe and some medicines are not safe during pregnancy.  · Take a prenatal vitamin that contains at least 600 micrograms (mcg) of folic acid.  · If you have trouble pooping (constipation), take medicine that will make your stool soft (stool softener) if your doctor approves.  Eating and drinking  · Eat regular, healthy meals.  · Your doctor will tell you the amount of weight gain that is right for you.  · Avoid raw meat and uncooked cheese.  · If you feel sick to your stomach (nauseous) or throw up (vomit):  ? Eat 4 or 5 small meals a day instead of 3 large meals.  ? Try eating a few soda crackers.  ? Drink liquids between meals instead of during meals.  · To prevent constipation:  ? Eat foods that are high in fiber, like fresh fruits and vegetables, whole grains, and beans.  ? Drink enough fluids to keep your pee (urine) clear or pale yellow.  Activity  · Exercise only as told by your doctor. Stop exercising if you have cramps or pain   in your lower belly (abdomen) or low back.  · Do not exercise if it is too hot, too humid, or if you are in a place of great height (high altitude).  · Try to avoid standing for long periods of time. Move your legs often if you must stand in one place for a long time.  · Avoid heavy lifting.  · Wear low-heeled shoes. Sit and stand up straight.  · You can have sex unless your doctor tells you not to.  Relieving pain and discomfort  · Wear a good support bra if your breasts are sore.  · Take warm water baths (sitz baths) to soothe pain or discomfort caused by hemorrhoids. Use hemorrhoid cream if your doctor says it is okay.  · Rest with your legs raised if you have leg cramps or low back pain.  · If you  have puffy, bulging veins (varicose veins) in your legs:  ? Wear support hose or compression stockings as told by your doctor.  ? Raise (elevate) your feet for 15 minutes, 3-4 times a day.  ? Limit salt in your food.  Prenatal care  · Schedule your prenatal visits by the twelfth week of pregnancy.  · Write down your questions. Take them to your prenatal visits.  · Keep all your prenatal visits as told by your doctor. This is important.  Safety  · Wear your seat belt at all times when driving.  · Make a list of emergency phone numbers. The list should include numbers for family, friends, the hospital, and police and fire departments.  General instructions  · Ask your doctor for a referral to a local prenatal class. Begin classes no later than at the start of month 6 of your pregnancy.  · Ask for help if you need counseling or if you need help with nutrition. Your doctor can give you advice or tell you where to go for help.  · Do not use hot tubs, steam rooms, or saunas.  · Do not douche or use tampons or scented sanitary pads.  · Do not cross your legs for long periods of time.  · Avoid all herbs and alcohol. Avoid drugs that are not approved by your doctor.  · Do not use any tobacco products, including cigarettes, chewing tobacco, and electronic cigarettes. If you need help quitting, ask your doctor. You may get counseling or other support to help you quit.  · Avoid cat litter boxes and soil used by cats. These carry germs that can cause birth defects in the baby and can cause a loss of your baby (miscarriage) or stillbirth.  · Visit your dentist. At home, brush your teeth with a soft toothbrush. Be gentle when you floss.  Contact a doctor if:  · You are dizzy.  · You have mild cramps or pressure in your lower belly.  · You have a nagging pain in your belly area.  · You continue to feel sick to your stomach, you throw up, or you have watery poop (diarrhea).  · You have a bad smelling fluid coming from your  vagina.  · You have pain when you pee (urinate).  · You have increased puffiness (swelling) in your face, hands, legs, or ankles.  Get help right away if:  · You have a fever.  · You are leaking fluid from your vagina.  · You have spotting or bleeding from your vagina.  · You have very bad belly cramping or pain.  · You   gain or lose weight rapidly.  · You throw up blood. It may look like coffee grounds.  · You are around people who have German measles, fifth disease, or chickenpox.  · You have a very bad headache.  · You have shortness of breath.  · You have any kind of trauma, such as from a fall or a car accident.  Summary  · The first trimester of pregnancy is from week 1 until the end of week 13 (months 1 through 3).  · To take care of yourself and your unborn baby, you will need to eat healthy meals, take medicines only if your doctor tells you to do so, and do activities that are safe for you and your baby.  · Keep all follow-up visits as told by your doctor. This is important as your doctor will have to ensure that your baby is healthy and growing well.  This information is not intended to replace advice given to you by your health care provider. Make sure you discuss any questions you have with your health care provider.  Document Released: 06/03/2008 Document Revised: 12/24/2016 Document Reviewed: 12/24/2016  Elsevier Interactive Patient Education © 2017 Elsevier Inc.

## 2017-12-26 NOTE — MAU Note (Signed)
Pt presents for checkup, [facility]  Reports she's feels dehydrated, lightheaded and dizzy.  Denies VB or LOF.  Reports +FM.

## 2017-12-26 NOTE — Progress Notes (Signed)
Attempting to apply EFM.  Unable to decipher if FHT heard versus maternal pulse.  Pulse ox applied.  Georgiana ShoreL. Weston, RN, charge nurse asked to assist in finding FHT.  Initially used U/S transducer, unable to capture FHT, then attempted to doppler FHT.  Again, unable to determine if auscultating FHT or maternal pulse. Paulina FusiK. Kooistra, CNM notified and requested @ bedside for U/S.  K. Kooistra @ bedside followed by Dr. Earlene Plateravis.  Bedside ultrasound performed by radiology and no fetus seen in uterus.   Pt asked when was last prenatal visit, reports was seen in October @ [facility]. Reports FHT dopplered @ 147 bpm.

## 2018-01-02 ENCOUNTER — Other Ambulatory Visit: Payer: Medicaid Other

## 2018-01-02 ENCOUNTER — Ambulatory Visit: Payer: Medicaid Other

## 2018-01-03 ENCOUNTER — Other Ambulatory Visit: Payer: Self-pay

## 2018-01-03 ENCOUNTER — Inpatient Hospital Stay (HOSPITAL_COMMUNITY)
Admission: AD | Admit: 2018-01-03 | Discharge: 2018-01-03 | Disposition: A | Discharge status: Home / Self Care | Payer: Medicaid Other | Source: Ambulatory Visit | Attending: Obstetrics and Gynecology | Admitting: Obstetrics and Gynecology

## 2018-01-03 ENCOUNTER — Encounter (HOSPITAL_COMMUNITY): Payer: Self-pay | Admitting: *Deleted

## 2018-01-03 DIAGNOSIS — R42 Dizziness and giddiness: Secondary | ICD-10-CM

## 2018-01-03 DIAGNOSIS — Z87891 Personal history of nicotine dependence: Secondary | ICD-10-CM | POA: Diagnosis not present

## 2018-01-03 DIAGNOSIS — Z79899 Other long term (current) drug therapy: Secondary | ICD-10-CM | POA: Insufficient documentation

## 2018-01-03 DIAGNOSIS — R197 Diarrhea, unspecified: Secondary | ICD-10-CM | POA: Diagnosis not present

## 2018-01-03 DIAGNOSIS — O039 Complete or unspecified spontaneous abortion without complication: Secondary | ICD-10-CM | POA: Diagnosis not present

## 2018-01-03 DIAGNOSIS — Z886 Allergy status to analgesic agent status: Secondary | ICD-10-CM | POA: Diagnosis not present

## 2018-01-03 DIAGNOSIS — Z832 Family history of diseases of the blood and blood-forming organs and certain disorders involving the immune mechanism: Secondary | ICD-10-CM | POA: Diagnosis not present

## 2018-01-03 DIAGNOSIS — G4700 Insomnia, unspecified: Secondary | ICD-10-CM | POA: Diagnosis not present

## 2018-01-03 LAB — CBC
HCT: 34.6 % — ABNORMAL LOW (ref 36.0–46.0)
HEMOGLOBIN: 12.1 g/dL (ref 12.0–15.0)
MCH: 28.7 pg (ref 26.0–34.0)
MCHC: 35 g/dL (ref 30.0–36.0)
MCV: 82 fL (ref 78.0–100.0)
Platelets: 235 10*3/uL (ref 150–400)
RBC: 4.22 MIL/uL (ref 3.87–5.11)
RDW: 14.6 % (ref 11.5–15.5)
WBC: 6.9 10*3/uL (ref 4.0–10.5)

## 2018-01-03 LAB — HCG, QUANTITATIVE, PREGNANCY: HCG, BETA CHAIN, QUANT, S: 19 m[IU]/mL — AB (ref <5)

## 2018-01-03 LAB — URINALYSIS, ROUTINE W REFLEX MICROSCOPIC
Bilirubin Urine: NEGATIVE
GLUCOSE, UA: NEGATIVE mg/dL
HGB URINE DIPSTICK: NEGATIVE
Ketones, ur: 5 mg/dL — AB
Leukocytes, UA: NEGATIVE
NITRITE: NEGATIVE
PROTEIN: 100 mg/dL — AB
SPECIFIC GRAVITY, URINE: 1.032 — AB (ref 1.005–1.030)
pH: 5 (ref 5.0–8.0)

## 2018-01-03 LAB — POCT PREGNANCY, URINE: Preg Test, Ur: NEGATIVE

## 2018-01-03 NOTE — MAU Provider Note (Signed)
History     CSN: [id number]  Arrival date and time: 01/03/18 2137   First Provider Initiated Contact with Patient 01/03/18 2246     Chief Complaint  Patient presents with  . Dizziness   HPI Erica Vargas is a 22 y.o. G2P0010 who presents with multiple complaints. She states she has felt light headed for a week. She reports 3 episodes of diarrhea today, last time at 6pm. She states she has been eating normal meals and drinking water. She denies any exposure to anyone with similar symptoms. She also reports insomnia for the last week. She was seen in MAU on 12/28 with a quant of 61 and did not follow up after discharge. Patient states her friend had to sign in and said she wanted to be seen too.   OB History    Gravida Para Term Preterm AB Living   2 0 0 0 1     SAB TAB Ectopic Multiple Live Births   1 0 0          Past Medical History:  Diagnosis Date  . Medical history non-contributory     Past Surgical History:  Procedure Laterality Date  . NO PAST SURGERIES      Family History  Problem Relation Age of Onset  . Sickle cell anemia Mother     Social History   Tobacco Use  . Smoking status: Former Smoker    Types: Cigars  . Smokeless tobacco: Never Used  Substance Use Topics  . Alcohol use: No  . Drug use: No    Allergies:  Allergies  Allergen Reactions  . Naproxen Hives    Medications Prior to Admission  Medication Sig Dispense Refill Last Dose  . neomycin-polymyxin-hydrocortisone (CORTISPORIN) 3.5-10000-1 otic suspension Place 4 drops into the right ear 3 (three) times daily. 10 mL 0     Review of Systems  Constitutional: Negative.  Negative for fatigue and fever.  HENT: Negative.   Respiratory: Negative.  Negative for shortness of breath.   Cardiovascular: Negative.  Negative for chest pain.  Gastrointestinal: Positive for diarrhea. Negative for abdominal pain, constipation, nausea and vomiting.  Genitourinary: Negative.  Negative for dysuria.   Neurological: Positive for light-headedness. Negative for dizziness and headaches.  Psychiatric/Behavioral: Positive for sleep disturbance.   Physical Exam   Blood pressure 102/68, pulse 62, temperature 98.5 F (36.9 C), resp. rate 18, height 5\' 5"  (1.651 m), weight 120 lb (54.4 kg), last menstrual period 06/11/2017, unknown if currently breastfeeding.  Physical Exam  Nursing note and vitals reviewed. Constitutional: She is oriented to person, place, and time. She appears well-developed and well-nourished. No distress.  HENT:  Head: Normocephalic.  Eyes: Pupils are equal, round, and reactive to light.  Cardiovascular: Normal rate, regular rhythm and normal heart sounds.  Respiratory: Effort normal and breath sounds normal. No respiratory distress.  GI: Soft. Bowel sounds are normal. She exhibits no distension. There is no tenderness.  Neurological: She is alert and oriented to person, place, and time.  Skin: Skin is warm and dry.  Psychiatric: She has a normal mood and affect. Her behavior is normal. Judgment and thought content normal.   Orthostatic VS for the past 24 hrs (Last 3 readings):  BP- Lying Pulse- Lying BP- Sitting Pulse- Sitting BP- Standing at 0 minutes Pulse- Standing at 0 minutes BP- Standing at 3 minutes Pulse- Standing at 3 minutes  01/03/18 2319 110/64 60 109/68 53 110/60 69 101/71 81    MAU Course  Procedures Results  for orders placed or performed during the hospital encounter of 01/03/18 (from the past 24 hour(s))  CBC     Status: Abnormal   Collection Time: 01/03/18  9:42 PM  Result Value Ref Range   WBC 6.9 4.0 - 10.5 K/uL   RBC 4.22 3.87 - 5.11 MIL/uL   Hemoglobin 12.1 12.0 - 15.0 g/dL   HCT 16.1 (L) 09.6 - 04.5 %   MCV 82.0 78.0 - 100.0 fL   MCH 28.7 26.0 - 34.0 pg   MCHC 35.0 30.0 - 36.0 g/dL   RDW 40.9 81.1 - 91.4 %   Platelets 235 150 - 400 K/uL  hCG, quantitative, pregnancy     Status: Abnormal   Collection Time: 01/03/18  9:42 PM  Result  Value Ref Range   hCG, Beta Chain, Quant, S 19 (H) <5 mIU/mL  Urinalysis, Routine w reflex microscopic     Status: Abnormal   Collection Time: 01/03/18 10:05 PM  Result Value Ref Range   Color, Urine YELLOW YELLOW   APPearance HAZY (A) CLEAR   Specific Gravity, Urine 1.032 (H) 1.005 - 1.030   pH 5.0 5.0 - 8.0   Glucose, UA NEGATIVE NEGATIVE mg/dL   Hgb urine dipstick NEGATIVE NEGATIVE   Bilirubin Urine NEGATIVE NEGATIVE   Ketones, ur 5 (A) NEGATIVE mg/dL   Protein, ur 782 (A) NEGATIVE mg/dL   Nitrite NEGATIVE NEGATIVE   Leukocytes, UA NEGATIVE NEGATIVE   RBC / HPF 0-5 0 - 5 RBC/hpf   WBC, UA 0-5 0 - 5 WBC/hpf   Bacteria, UA RARE (A) NONE SEEN   Squamous Epithelial / LPF 0-5 (A) NONE SEEN   Mucus PRESENT   Pregnancy, urine POC     Status: None   Collection Time: 01/03/18 10:20 PM  Result Value Ref Range   Preg Test, Ur NEGATIVE NEGATIVE    MDM UA, UPT CBC Orthostatic vital signs No evidence of any acute processes. VSS stable, patient on cell phone during conversation. After a long discussion with patient, patient states she is here to recheck her hormone level because she did not keep her appointment in clinic and feels stressed about the situation. Will repeat HCG tonight.   Drop in HCG likely indicative of miscarriage. Will have patient follow up in the clinic in a week for repeat HCG and in 2 weeks with provider.  Assessment and Plan   1. Miscarriage   2. Lightheadedness    -Discharge home in stable condition -OTC treatments for sleep reviewed with patient -Patient [facility] for repeat HCG -Patient may return to MAU as needed or if her condition were to change or worsen  Rolm Bookbinder CNM 01/03/2018, 11:31 PM

## 2018-01-03 NOTE — MAU Note (Signed)
I was here with a friend and feeling lightheaded. Some loose stools today x 3. No vag bleeding or discharge. No appetite. No vomiting.

## 2018-01-09 ENCOUNTER — Ambulatory Visit: Payer: Medicaid Other

## 2018-01-09 ENCOUNTER — Telehealth: Payer: Self-pay | Admitting: General Practice

## 2018-01-09 DIAGNOSIS — O039 Complete or unspecified spontaneous abortion without complication: Secondary | ICD-10-CM

## 2018-01-09 NOTE — Telephone Encounter (Signed)
Patient no showed for stat bhcg today, per chart review- lab was not supposed to be a stat. Called patient & informed her of missed appt. Patient states she forgot, but will come in Monday 1/14 @ 2pm. Told patient someone from our front office will also call her for a follow up appt with a provider. Patient verbalized understanding and had no questions

## 2018-01-12 ENCOUNTER — Other Ambulatory Visit: Payer: Medicaid Other

## 2018-01-19 ENCOUNTER — Ambulatory Visit: Payer: Medicaid Other | Admitting: Advanced Practice Midwife

## 2018-02-16 ENCOUNTER — Other Ambulatory Visit: Payer: Self-pay

## 2018-02-16 ENCOUNTER — Emergency Department (HOSPITAL_COMMUNITY)
Admission: EM | Admit: 2018-02-16 | Discharge: 2018-02-16 | Disposition: A | Discharge status: Home / Self Care | Payer: Medicaid Other | Attending: Emergency Medicine | Admitting: Emergency Medicine

## 2018-02-16 ENCOUNTER — Encounter (HOSPITAL_COMMUNITY): Payer: Self-pay

## 2018-02-16 DIAGNOSIS — R109 Unspecified abdominal pain: Secondary | ICD-10-CM | POA: Insufficient documentation

## 2018-02-16 DIAGNOSIS — Z5321 Procedure and treatment not carried out due to patient leaving prior to being seen by health care provider: Secondary | ICD-10-CM | POA: Diagnosis not present

## 2018-02-16 LAB — COMPREHENSIVE METABOLIC PANEL
ALT: 10 U/L — AB (ref 14–54)
AST: 16 U/L (ref 15–41)
Albumin: 4 g/dL (ref 3.5–5.0)
Alkaline Phosphatase: 64 U/L (ref 38–126)
Anion gap: 10 (ref 5–15)
BILIRUBIN TOTAL: 1.1 mg/dL (ref 0.3–1.2)
BUN: 11 mg/dL (ref 6–20)
CHLORIDE: 103 mmol/L (ref 101–111)
CO2: 21 mmol/L — ABNORMAL LOW (ref 22–32)
CREATININE: 0.73 mg/dL (ref 0.44–1.00)
Calcium: 9.2 mg/dL (ref 8.9–10.3)
GFR calc Af Amer: >60 mL/min (ref >60)
Glucose, Bld: 82 mg/dL (ref 65–99)
POTASSIUM: 4 mmol/L (ref 3.5–5.1)
Sodium: 134 mmol/L — ABNORMAL LOW (ref 135–145)
TOTAL PROTEIN: 7.3 g/dL (ref 6.5–8.1)

## 2018-02-16 LAB — CBC
HEMATOCRIT: 36.8 % (ref 36.0–46.0)
Hemoglobin: 13 g/dL (ref 12.0–15.0)
MCH: 29 pg (ref 26.0–34.0)
MCHC: 35.3 g/dL (ref 30.0–36.0)
MCV: 82.1 fL (ref 78.0–100.0)
Platelets: 218 10*3/uL (ref 150–400)
RBC: 4.48 MIL/uL (ref 3.87–5.11)
RDW: 14.4 % (ref 11.5–15.5)
WBC: 7.4 10*3/uL (ref 4.0–10.5)

## 2018-02-16 LAB — LIPASE, BLOOD: Lipase: 26 U/L (ref 11–51)

## 2018-02-16 NOTE — ED Triage Notes (Signed)
Pt presents to the ed with complaints of abdominal pain, body aches, diarrhea and nausea x 1 day.

## 2018-02-16 NOTE — ED Notes (Signed)
Second call in lobby. No response. 

## 2018-02-16 NOTE — ED Notes (Signed)
First call in lobby for urine specimen collection. No response.

## 2018-02-16 NOTE — ED Notes (Signed)
Third call in lobby. No response. 

## 2018-06-09 ENCOUNTER — Emergency Department (HOSPITAL_COMMUNITY)
Admission: EM | Admit: 2018-06-09 | Discharge: 2018-06-09 | Disposition: A | Discharge status: Home / Self Care | Payer: Medicaid Other | Attending: Emergency Medicine | Admitting: Emergency Medicine

## 2018-06-09 DIAGNOSIS — H9201 Otalgia, right ear: Secondary | ICD-10-CM | POA: Diagnosis present

## 2018-06-09 DIAGNOSIS — J3489 Other specified disorders of nose and nasal sinuses: Secondary | ICD-10-CM | POA: Insufficient documentation

## 2018-06-09 DIAGNOSIS — Z87891 Personal history of nicotine dependence: Secondary | ICD-10-CM | POA: Insufficient documentation

## 2018-06-09 DIAGNOSIS — R05 Cough: Secondary | ICD-10-CM | POA: Diagnosis not present

## 2018-06-09 DIAGNOSIS — H65191 Other acute nonsuppurative otitis media, right ear: Secondary | ICD-10-CM | POA: Diagnosis not present

## 2018-06-09 DIAGNOSIS — J029 Acute pharyngitis, unspecified: Secondary | ICD-10-CM | POA: Diagnosis not present

## 2018-06-09 MED ORDER — FLUTICASONE PROPIONATE 50 MCG/ACT NA SUSP: 1.0000 | Freq: Every day | NASAL | 2 refills | Status: DC

## 2018-06-09 MED ORDER — AMOXICILLIN 500 MG PO CAPS: 500.0000 mg | ORAL_CAPSULE | Freq: Two times a day (BID) | ORAL | 0 refills | Status: AC

## 2018-06-09 NOTE — ED Triage Notes (Signed)
Body aches started a week ago, "sinus sickness" started today including pressure in sinus and right ear.  Despite OTC medications continues to feel poorly

## 2018-06-09 NOTE — ED Provider Notes (Signed)
[facility] Provider Note   CSN: [id number] Arrival date & time: 06/09/18  2026     History   Chief Complaint Chief Complaint  Patient presents with  . Generalized Body Aches  . sinus issues    HPI Erica Vargas is a 22 y.o. female who presents to ED for evaluation of 2-day history of sinus pressure, right ear pain, dry cough, she has been using DayQuil, NyQuil, Robitussin with only monitoring in her symptoms.  No sick contacts with similar symptoms.  Denies any fevers, vomiting, productive cough, drainage from the ear, tick bites. Of note, patient is not currently pregnant.  HPI  Past Medical History:  Diagnosis Date  . Medical history non-contributory     Patient Active Problem List   Diagnosis Date Noted  . Supervision of normal first pregnancy, antepartum 10/27/2017    Past Surgical History:  Procedure Laterality Date  . NO PAST SURGERIES       OB History    Gravida  2   Para  0   Term  0   Preterm  0   AB  1   Living        SAB  1   TAB  0   Ectopic  0   Multiple      Live Births               Home Medications    Prior to Admission medications   Medication Sig Start Date End Date Taking? Authorizing Provider  amoxicillin (AMOXIL) 500 MG capsule Take 1 capsule (500 mg total) by mouth 2 (two) times daily for 7 days. 06/09/18 06/16/18  Imojean Yoshino, PA-C  fluticasone (FLONASE) 50 MCG/ACT nasal spray Place 1 spray into both nostrils daily. 06/09/18   Dietrich PatesKhatri, Katerine Morua, PA-C    Family History Family History  Problem Relation Age of Onset  . Sickle cell anemia Mother     Social History Social History   Tobacco Use  . Smoking status: Former Smoker    Types: Cigars  . Smokeless tobacco: Never Used  Substance Use Topics  . Alcohol use: No  . Drug use: No     Allergies   Naproxen   Review of Systems Review of Systems  Constitutional: Negative for chills and fever.  HENT: Positive for  congestion, ear pain, sinus pressure, sinus pain and sore throat. Negative for postnasal drip and rhinorrhea.   Respiratory: Positive for cough. Negative for shortness of breath.   Gastrointestinal: Negative for vomiting.     Physical Exam Updated Vital Signs BP 113/81 (BP Location: Right Arm)   Pulse 94   Temp 98.5 F (36.9 C) (Oral)   Resp 18   Ht 5\' 5"  (1.651 m)   Wt 56.7 kg (125 lb)   LMP 01/20/2018   SpO2 99%   BMI 20.80 kg/m   Physical Exam  Constitutional: She appears well-developed and well-nourished. No distress.  HENT:  Head: Normocephalic and atraumatic.  Right Ear: Tympanic membrane is erythematous and bulging. A middle ear effusion is present.  Left Ear: Tympanic membrane normal.  Nose: Right sinus exhibits frontal sinus tenderness. Left sinus exhibits frontal sinus tenderness.  Mouth/Throat: Uvula is midline and oropharynx is clear and moist.  Patient does not appear to be in acute distress. No trismus or drooling present. No pooling of secretions. Patient is tolerating secretions and is not in respiratory distress. No neck pain or tenderness to palpation of the neck. Full active and passive  range of motion of the neck. No evidence of RPA or PTA.  Eyes: Conjunctivae and EOM are normal. No scleral icterus.  Neck: Normal range of motion.  Cardiovascular: Normal rate, regular rhythm and normal heart sounds.  Pulmonary/Chest: Effort normal and breath sounds normal. No respiratory distress.  Neurological: She is alert.  Skin: No rash noted. She is not diaphoretic.  Psychiatric: She has a normal mood and affect.  Nursing note and vitals reviewed.    ED Treatments / Results  Labs (all labs ordered are listed, but only abnormal results are displayed) Labs Reviewed - No data to display  EKG None  Radiology No results found.  Procedures Procedures (including critical care time)  Medications Ordered in ED Medications - No data to display   Initial  Impression / Assessment and Plan / ED Course  I have reviewed the triage vital signs and the nursing notes.  Pertinent labs & imaging results that were available during my care of the patient were reviewed by me and considered in my medical decision making (see chart for details).     Patient presents to ED for evaluation of sinus pressure, sore throat, cough for the past 2 days.  On physical exam she is overall well appearing.  She is afebrile.  Lungs are clear to auscultation bilaterally.  Is evidence of acute otitis media on the right TM.  Denies any tick bites or concern for tickborne illness.  Will treat with antibiotics, Flonase as needed and advised to continue supportive measures as needed.  Advised to return to ED for any severe worsening symptoms.  Portions of this note were generated with Scientist, clinical (histocompatibility and immunogenetics). Dictation errors may occur despite best attempts at proofreading.   Final Clinical Impressions(s) / ED Diagnoses   Final diagnoses:  Other non-recurrent acute nonsuppurative otitis media of right ear    ED Discharge Orders        Ordered    amoxicillin (AMOXIL) 500 MG capsule  2 times daily     06/09/18 2342    fluticasone (FLONASE) 50 MCG/ACT nasal spray  Daily     06/09/18 2342       Dietrich Pates, PA-C 06/09/18 2344    Palumbo, April, MD 06/09/18 2351

## 2018-06-17 ENCOUNTER — Encounter (HOSPITAL_COMMUNITY): Payer: Self-pay | Admitting: Emergency Medicine

## 2018-06-17 ENCOUNTER — Emergency Department (HOSPITAL_COMMUNITY)
Admission: EM | Admit: 2018-06-17 | Discharge: 2018-06-18 | Disposition: A | Discharge status: Home / Self Care | Payer: Medicaid Other | Attending: Emergency Medicine | Admitting: Emergency Medicine

## 2018-06-17 ENCOUNTER — Emergency Department (HOSPITAL_COMMUNITY): Payer: Medicaid Other

## 2018-06-17 DIAGNOSIS — R1011 Right upper quadrant pain: Secondary | ICD-10-CM | POA: Insufficient documentation

## 2018-06-17 DIAGNOSIS — Z79899 Other long term (current) drug therapy: Secondary | ICD-10-CM | POA: Diagnosis not present

## 2018-06-17 DIAGNOSIS — R109 Unspecified abdominal pain: Secondary | ICD-10-CM

## 2018-06-17 DIAGNOSIS — Z87891 Personal history of nicotine dependence: Secondary | ICD-10-CM | POA: Insufficient documentation

## 2018-06-17 LAB — COMPREHENSIVE METABOLIC PANEL
ALBUMIN: 4.9 g/dL (ref 3.5–5.0)
ALK PHOS: 62 U/L (ref 38–126)
ALT: 9 U/L — ABNORMAL LOW (ref 14–54)
AST: 21 U/L (ref 15–41)
Anion gap: 8 (ref 5–15)
BILIRUBIN TOTAL: 0.3 mg/dL (ref 0.3–1.2)
BUN: 11 mg/dL (ref 6–20)
CALCIUM: 9.8 mg/dL (ref 8.9–10.3)
CO2: 28 mmol/L (ref 22–32)
Chloride: 105 mmol/L (ref 101–111)
Creatinine, Ser: 0.89 mg/dL (ref 0.44–1.00)
GLUCOSE: 89 mg/dL (ref 65–99)
POTASSIUM: 4 mmol/L (ref 3.5–5.1)
Sodium: 141 mmol/L (ref 135–145)
TOTAL PROTEIN: 8.3 g/dL — AB (ref 6.5–8.1)

## 2018-06-17 LAB — CBC WITH DIFFERENTIAL/PLATELET
BASOS PCT: 0 %
Basophils Absolute: 0 10*3/uL (ref 0.0–0.1)
Eosinophils Absolute: 0 10*3/uL (ref 0.0–0.7)
Eosinophils Relative: 0 %
HCT: 40.6 % (ref 36.0–46.0)
HEMOGLOBIN: 14.4 g/dL (ref 12.0–15.0)
LYMPHS ABS: 2.3 10*3/uL (ref 0.7–4.0)
Lymphocytes Relative: 26 %
MCH: 29.1 pg (ref 26.0–34.0)
MCHC: 35.5 g/dL (ref 30.0–36.0)
MCV: 82.2 fL (ref 78.0–100.0)
MONO ABS: 0.3 10*3/uL (ref 0.1–1.0)
MONOS PCT: 3 %
Neutro Abs: 6 10*3/uL (ref 1.7–7.7)
Neutrophils Relative %: 71 %
Platelets: 321 10*3/uL (ref 150–400)
RBC: 4.94 MIL/uL (ref 3.87–5.11)
RDW: 14 % (ref 11.5–15.5)
WBC: 8.6 10*3/uL (ref 4.0–10.5)

## 2018-06-17 LAB — I-STAT BETA HCG BLOOD, ED (MC, WL, AP ONLY)

## 2018-06-17 LAB — WET PREP, GENITAL
Clue Cells Wet Prep HPF POC: NONE SEEN
SPERM: NONE SEEN
Trich, Wet Prep: NONE SEEN
Yeast Wet Prep HPF POC: NONE SEEN

## 2018-06-17 MED ORDER — ONDANSETRON HCL 4 MG/2ML IJ SOLN
4.0000 mg | Freq: Once | INTRAMUSCULAR | Status: AC
Start: 2018-06-17 — End: 2018-06-17
  Administered 2018-06-17: 4 mg via INTRAVENOUS
  Filled 2018-06-17: qty 2

## 2018-06-17 MED ORDER — IOPAMIDOL (ISOVUE-300) INJECTION 61%
INTRAVENOUS | Status: AC
  Filled 2018-06-17: qty 100

## 2018-06-17 MED ORDER — MORPHINE SULFATE (PF) 4 MG/ML IV SOLN
4.0000 mg | Freq: Once | INTRAVENOUS | Status: AC
  Administered 2018-06-17: 4 mg via INTRAVENOUS
  Filled 2018-06-17: qty 1

## 2018-06-17 MED ORDER — IOPAMIDOL (ISOVUE-300) INJECTION 61%
100.0000 mL | Freq: Once | INTRAVENOUS | Status: AC | PRN
  Administered 2018-06-17: 100 mL via INTRAVENOUS

## 2018-06-17 MED ORDER — SODIUM CHLORIDE 0.9 % IV BOLUS
1000.0000 mL | Freq: Once | INTRAVENOUS | Status: AC
  Administered 2018-06-17: 1000 mL via INTRAVENOUS

## 2018-06-17 NOTE — ED Provider Notes (Signed)
9:35 PM Pt was screen by me in fast track.  Patient initially complained of diffuse body aches.  Upon my further history taking, patient admitted that she is only having pain in her right flank that radiates into the right lower abdomen.  Symptoms started approximately week ago after she was treated here for sinusitis and was started amoxicillin.  She states pain is sharp and dull at the same time, worse with any movement.  Denies any urinary symptoms.  No vaginal discharge or bleeding.  No nausea or vomiting.  Examination: Patient appears to be very uncomfortable, moving slow, states any movement makes pain worse.  Right CVA tenderness present.  Right lower quadrant tenderness with guarding.  I will place basic labs, pregnancy test, urinalysis and move patient to the back for further evaluation.   Vitals:   06/17/18 2121  BP: 117/70  Pulse: 79  Resp: 16  Temp: 98.2 F (36.8 C)  TempSrc: Oral  SpO2: 100%      Jaynie CrumbleKirichenko, Huldah Marin, PA-C 06/17/18 2137    Charlynne PanderYao, David Hsienta, MD 06/17/18 2226

## 2018-06-17 NOTE — ED Triage Notes (Signed)
Patient c/o generalized body aches since taking amoxicillin x1 week after [facility]. Reports previous symptoms have resolved since taking antibiotic.

## 2018-06-17 NOTE — ED Provider Notes (Signed)
[facility]-EMERGENCY DEPT Provider Note   CSN: [id number] Arrival date & time: 06/17/18  2106     History   Chief Complaint Chief Complaint  Patient presents with  . Generalized Body Aches    HPI Erica Vargas is a 22 y.o. female.  HPI   Erica Vargas is a 22 y.o. female, patient with no pertinent past medical history, presenting to the ED with right flank and lower back pain beginning about a week ago.  Pain was initially intermittent, now constant and radiating into the right lower quadrant of the abdomen.  "Feels like someone punched me in the side," 7/10.  Worse with movement and palpation.  LMP beginning of June. States she went to the gym today.  Denies fever/chills, N/V/D, abnormal vaginal discharge or bleeding, urinary symptoms, hematochezia/melena, or any other complaints.   Past Medical History:  Diagnosis Date  . Medical history non-contributory     Patient Active Problem List   Diagnosis Date Noted  . Supervision of normal first pregnancy, antepartum 10/27/2017    Past Surgical History:  Procedure Laterality Date  . NO PAST SURGERIES       OB History    Gravida  2   Para  0   Term  0   Preterm  0   AB  1   Living        SAB  1   TAB  0   Ectopic  0   Multiple      Live Births               Home Medications    Prior to Admission medications   Medication Sig Start Date End Date Taking? Authorizing Provider  acetaminophen (TYLENOL) 500 MG tablet Take 500 mg by mouth every 6 (six) hours as needed.   Yes [provider]  fluticasone (FLONASE) 50 MCG/ACT nasal spray Place 1 spray into both nostrils daily. 06/09/18  Yes Khatri, Hina, PA-C  lidocaine (LIDODERM) 5 % Place 1 patch onto the skin daily. Remove & Discard patch within 12 hours or as directed by MD 06/18/18   Henleigh Robello C, PA-C  methocarbamol (ROBAXIN) 500 MG tablet Take 1 tablet (500 mg total) by mouth 2 (two) times daily. 06/18/18   Ameerah Huffstetler, Hillard Danker,  PA-C    Family History Family History  Problem Relation Age of Onset  . Sickle cell anemia Mother     Social History Social History   Tobacco Use  . Smoking status: Former Smoker    Types: Cigars  . Smokeless tobacco: Never Used  Substance Use Topics  . Alcohol use: No  . Drug use: No     Allergies   Naproxen   Review of Systems Review of Systems  Constitutional: Negative for chills and fever.  Respiratory: Negative for shortness of breath.   Cardiovascular: Negative for chest pain.  Gastrointestinal: Positive for abdominal pain. Negative for blood in stool, diarrhea, nausea and vomiting.  Genitourinary: Positive for flank pain.  Musculoskeletal: Positive for back pain.  All other systems reviewed and are negative.    Physical Exam Updated Vital Signs BP 117/70 (BP Location: Right Arm)   Pulse 79   Temp 98.2 F (36.8 C) (Oral)   Resp 16   LMP 05/31/2018   SpO2 100%   Physical Exam  Constitutional: She appears well-developed and well-nourished. No distress.  HENT:  Head: Normocephalic and atraumatic.  Eyes: Conjunctivae are normal.  Neck: Neck supple.  Cardiovascular: Normal rate,  regular rhythm, normal heart sounds and intact distal pulses.  Pulmonary/Chest: Effort normal and breath sounds normal. No respiratory distress.  Abdominal: Soft. There is tenderness. There is no guarding.    Genitourinary: Cervix exhibits no motion tenderness. Right adnexum displays tenderness.  Genitourinary Comments: External genitalia normal Vagina with discharge - Small amount of normal-appearing white discharge Cervix  normal negative for cervical motion tenderness Adnexa palpated, no masses, positive for tenderness noted on right. Bladder palpated negative for tenderness Uterus palpated no masses, negative for tenderness  No inguinal lymphadenopathy. Otherwise normal female genitalia. Med [place], [place], served as Biomedical engineer during exam.  Musculoskeletal: She exhibits  no edema.       Back:  Lymphadenopathy:    She has no cervical adenopathy.  Neurological: She is alert.  Skin: Skin is warm and dry. She is not diaphoretic.  Psychiatric: She has a normal mood and affect. Her behavior is normal.  Nursing note and vitals reviewed.    ED Treatments / Results  Labs (all labs ordered are listed, but only abnormal results are displayed) Labs Reviewed  WET PREP, GENITAL - Abnormal; Notable for the following components:      Result Value   WBC, Wet Prep HPF POC RARE (*)    All other components within normal limits  COMPREHENSIVE METABOLIC PANEL - Abnormal; Notable for the following components:   Total Protein 8.3 (*)    ALT 9 (*)    All other components within normal limits  URINALYSIS, ROUTINE W REFLEX MICROSCOPIC  CBC WITH DIFFERENTIAL/PLATELET  RPR  HIV ANTIBODY (ROUTINE TESTING)  I-STAT BETA HCG BLOOD, ED ([facility], [facility], [facility] ONLY)  GC/CHLAMYDIA PROBE AMP ([place]) NOT AT [facility]    EKG None  Radiology Ct Abdomen Pelvis W Contrast  Result Date: 06/17/2018 CLINICAL DATA:  22 year old female with abdominal pain, possible appendicitis. EXAM: CT ABDOMEN AND PELVIS WITH CONTRAST TECHNIQUE: Multidetector CT imaging of the abdomen and pelvis was performed using the standard protocol following bolus administration of intravenous contrast. CONTRAST:  ISOVUE-300 IOPAMIDOL (ISOVUE-300) INJECTION 61% COMPARISON:  None. FINDINGS: Lower chest: Normal lung bases.  No pericardial or pleural effusion. Hepatobiliary: Negative liver and gallbladder. Pancreas: Negative. Spleen: Negative. Adrenals/Urinary Tract: Normal adrenal glands. Symmetric and normal bilateral renal enhancement. No perinephric stranding. No hydronephrosis or proximal hydroureter. Diminutive and unremarkable urinary bladder. Stomach/Bowel: Decompressed rectosigmoid colon. Negative descending colon. Mildly redundant but otherwise negative transverse colon. Normal ascending colon. The cecum is mostly  located within the right pelvis. Normal appendix visible on series 2, image 59 and coronal image 45. Negative terminal ileum. No dilated small bowel. Stomach and duodenum appear within normal limits. No abdominal free air, free fluid. Vascular/Lymphatic: The major arterial structures in the abdomen and pelvis are normal. The portal venous system is patent. No lymphadenopathy. Reproductive: Retroverted uterus, normal variant. Both ovaries appear within normal limits; physiologic cyst or follicle suspected on the right. No parametrial inflammatory stranding. Other: No pelvic free fluid. Musculoskeletal: Negative. IMPRESSION: Normal appendix.  Normal CT Abdomen and Pelvis. Electronically Signed   By: Odessa Fleming M.D.   On: 06/17/2018 23:34    Procedures Procedures (including critical care time)  Medications Ordered in ED Medications  iopamidol (ISOVUE-300) 61 % injection (has no administration in time range)  morphine 4 MG/ML injection 4 mg (4 mg Intravenous Given 06/17/18 2207)  ondansetron (ZOFRAN) injection 4 mg (4 mg Intravenous Given 06/17/18 2208)  sodium chloride 0.9 % bolus 1,000 mL (0 mLs Intravenous Stopped 06/17/18 2310)  iopamidol (  ISOVUE-300) 61 % injection 100 mL (100 mLs Intravenous Contrast Given 06/17/18 2306)     Initial Impression / Assessment and Plan / ED Course  I have reviewed the triage vital signs and the nursing notes.  Pertinent labs & imaging results that were available during my care of the patient were reviewed by me and considered in my medical decision making (see chart for details).  Clinical Course as of Jun 18 18  Thu Jun 18, 2018  0011 Discussed imaging and lab results with the patient.  Patient appears relaxed, lying on the bed, playing with her phone.    [SJ]    Clinical Course User Index [SJ] Kamber Vignola C, PA-C    Patient presents with pain to the right flank, lower back, and lower right abdomen. Patient is nontoxic appearing, afebrile, not tachycardic, not  tachypneic, not hypotensive, maintains SPO2 of 100% on room air, and is in no apparent distress.  No leukocytosis.  No acute abnormalities on CT. UA without abnormalities.  Patient's pain resolved with conservative management. The patient was given instructions for home care as well as return precautions. Patient voices understanding of these instructions, accepts the plan, and is comfortable with discharge.     Vitals:   06/17/18 2121 06/17/18 2148 06/17/18 2300  BP: 117/70 112/77 115/71  Pulse: 79 86   Resp: 16    Temp: 98.2 F (36.8 C)    TempSrc: Oral    SpO2: 100% 100%      Final Clinical Impressions(s) / ED Diagnoses   Final diagnoses:  Flank pain    ED Discharge Orders        Ordered    HYDROcodone-acetaminophen (NORCO/VICODIN) 5-325 MG tablet  Every 6 hours PRN,   Status:  Discontinued     06/18/18 0001    methocarbamol (ROBAXIN) 500 MG tablet  2 times daily     06/18/18 0010    lidocaine (LIDODERM) 5 %  Every 24 hours     06/18/18 0010       Anselm PancoastJoy, Adalie Mand C, PA-C 06/18/18 Amaryllis Dyke0021    Knapp, Jon, MD 06/18/18 [phone]

## 2018-06-18 LAB — URINALYSIS, ROUTINE W REFLEX MICROSCOPIC
Bilirubin Urine: NEGATIVE
GLUCOSE, UA: NEGATIVE mg/dL
Hgb urine dipstick: NEGATIVE
KETONES UR: NEGATIVE mg/dL
Leukocytes, UA: NEGATIVE
Nitrite: NEGATIVE
PH: 7 (ref 5.0–8.0)
Protein, ur: NEGATIVE mg/dL
SPECIFIC GRAVITY, URINE: 1.024 (ref 1.005–1.030)

## 2018-06-18 LAB — GC/CHLAMYDIA PROBE AMP (~~LOC~~) NOT AT ARMC
CHLAMYDIA, DNA PROBE: NEGATIVE
Neisseria Gonorrhea: NEGATIVE

## 2018-06-18 MED ORDER — LIDOCAINE 5 % EX PTCH: 1.0000 | MEDICATED_PATCH | CUTANEOUS | 0 refills | Status: DC

## 2018-06-18 MED ORDER — HYDROCODONE-ACETAMINOPHEN 5-325 MG PO TABS: 1.0000 | ORAL_TABLET | Freq: Four times a day (QID) | ORAL | 0 refills | Status: DC | PRN

## 2018-06-18 MED ORDER — METHOCARBAMOL 500 MG PO TABS: 500.0000 mg | ORAL_TABLET | Freq: Two times a day (BID) | ORAL | 0 refills | Status: DC

## 2018-06-18 NOTE — Discharge Instructions (Signed)
°  Antiinflammatory medications: If you can take it, take 600 mg of ibuprofen every 6 hours for the next 3 days. After this time, this medication may be used as needed for pain. Take this medication with food to avoid upset stomach. Tylenol: Should you continue to have additional pain while taking the ibuprofen or naproxen, you may add in tylenol as needed. Your daily total maximum amount of tylenol from all sources should be limited to 4000mg /day for persons without liver problems, or 2000mg /day for those with liver problems. Muscle relaxer: Robaxin is a muscle relaxer and may help loosen stiff muscles. Do not take the Robaxin while driving or performing other dangerous activities.  Lidocaine patches: These are available via either prescription or over-the-counter. The over-the-counter option may be more economical one and are likely just as effective. There are multiple over-the-counter brands, such as Salonpas.  Follow up with a primary care provider for any future management of these complaints. Return to the ED for any worsening symptoms.

## 2018-07-16 ENCOUNTER — Encounter (HOSPITAL_COMMUNITY): Payer: Self-pay

## 2018-07-16 ENCOUNTER — Other Ambulatory Visit: Payer: Self-pay

## 2018-07-16 ENCOUNTER — Emergency Department (HOSPITAL_COMMUNITY)
Admission: EM | Admit: 2018-07-16 | Discharge: 2018-07-16 | Disposition: A | Discharge status: Home / Self Care | Payer: Medicaid Other | Attending: Emergency Medicine | Admitting: Emergency Medicine

## 2018-07-16 DIAGNOSIS — M791 Myalgia, unspecified site: Secondary | ICD-10-CM | POA: Insufficient documentation

## 2018-07-16 DIAGNOSIS — Z79899 Other long term (current) drug therapy: Secondary | ICD-10-CM | POA: Insufficient documentation

## 2018-07-16 DIAGNOSIS — Z87891 Personal history of nicotine dependence: Secondary | ICD-10-CM | POA: Insufficient documentation

## 2018-07-16 NOTE — Discharge Instructions (Addendum)
Please follow up with your Medicaid physician for a primary care visit

## 2018-07-16 NOTE — ED Notes (Signed)
ED Provider at bedside. 

## 2018-07-16 NOTE — ED Triage Notes (Signed)
Pt presents to ED for generalized pain. Pt reports that she has had body pain for 3 weeks. Pt reports that she was prescribed "muscle relaxers" and they helped at first, but it's not helping anymore.

## 2018-07-16 NOTE — ED Notes (Signed)
Bed: [facility] Expected date:  Expected time:  Means of arrival:  Comments: 

## 2018-07-21 NOTE — ED Provider Notes (Signed)
[facility]-EMERGENCY DEPT Provider Note   CSN: [id number] Arrival date & time: 07/16/18  0640     History   Chief Complaint Chief Complaint  Patient presents with  . Generalized Body Aches    HPI Erica Vargas is a 22 y.o. female.  HPI Patient is a 22 year old female presents the emergency department 3 weeks of muscle aches.  She denies fevers and chills.  Eating and drinking normally.  No nausea vomiting or diarrhea.  No dysuria or urinary frequency.  No new medications.  No over-the-counter medications.  No herbal supplements or dietary supplements.  Complaint is simply aches in multiple areas of her muscles.  No specific joint pain.  Pain is moderate in severity.  She was recently prescribed muscle relaxants and reports that they helped at first but no longer is helping.  She does not have a primary care physician.   Past Medical History:  Diagnosis Date  . Medical history non-contributory     Patient Active Problem List   Diagnosis Date Noted  . Supervision of normal first pregnancy, antepartum 10/27/2017    Past Surgical History:  Procedure Laterality Date  . NO PAST SURGERIES       OB History    Gravida  2   Para  0   Term  0   Preterm  0   AB  1   Living        SAB  1   TAB  0   Ectopic  0   Multiple      Live Births               Home Medications    Prior to Admission medications   Medication Sig Start Date End Date Taking? Authorizing Provider  methocarbamol (ROBAXIN) 500 MG tablet Take 1 tablet (500 mg total) by mouth 2 (two) times daily. 06/18/18  Yes Joy, Hillard Danker, PA-C    Family History Family History  Problem Relation Age of Onset  . Sickle cell anemia Mother     Social History Social History   Tobacco Use  . Smoking status: Former Smoker    Types: Cigars  . Smokeless tobacco: Never Used  Substance Use Topics  . Alcohol use: No  . Drug use: No     Allergies   Amoxicillin; Naproxen; and  Lidocaine   Review of Systems Review of Systems  All other systems reviewed and are negative.    Physical Exam Updated Vital Signs BP 112/70 (BP Location: Left Arm)   Pulse 79   Temp 98.1 F (36.7 C) (Oral)   Resp 16   Ht 5\' 5"  (1.651 m)   Wt 56.7 kg (125 lb)   LMP 05/31/2018   SpO2 98%   BMI 20.80 kg/m   Physical Exam  Constitutional: She is oriented to person, place, and time. She appears well-developed and well-nourished. No distress.  HENT:  Head: Normocephalic and atraumatic.  Eyes: EOM are normal.  Neck: Normal range of motion.  Cardiovascular: Normal rate, regular rhythm and normal heart sounds.  Pulmonary/Chest: Effort normal and breath sounds normal.  Abdominal: Soft. She exhibits no distension. There is no tenderness.  Musculoskeletal: Normal range of motion.  Neurological: She is alert and oriented to person, place, and time.  Skin: Skin is warm and dry.  Psychiatric: She has a normal mood and affect. Judgment normal.  Nursing note and vitals reviewed.    ED Treatments / Results  Labs (all labs ordered are listed,  but only abnormal results are displayed) Labs Reviewed - No data to display  EKG None  Radiology No results found.  Procedures Procedures (including critical care time)  Medications Ordered in ED Medications - No data to display   Initial Impression / Assessment and Plan / ED Course  I have reviewed the triage vital signs and the nursing notes.  Pertinent labs & imaging results that were available during my care of the patient were reviewed by me and considered in my medical decision making (see chart for details).     Vital signs are stable.  Well-appearing.  Nonspecific complaints.  Patient will need follow-up with her primary care physician.  Final Clinical Impressions(s) / ED Diagnoses   Final diagnoses:  Myalgia    ED Discharge Orders    None       Azalia Bilisampos, Nicholis Stepanek, MD 07/21/18 2340

## 2018-07-29 ENCOUNTER — Emergency Department (HOSPITAL_COMMUNITY): Payer: Medicaid Other

## 2018-07-29 ENCOUNTER — Emergency Department (HOSPITAL_COMMUNITY)
Admission: EM | Admit: 2018-07-29 | Discharge: 2018-07-29 | Disposition: A | Discharge status: Home / Self Care | Payer: Medicaid Other | Attending: Emergency Medicine | Admitting: Emergency Medicine

## 2018-07-29 ENCOUNTER — Encounter (HOSPITAL_COMMUNITY): Payer: Self-pay | Admitting: Emergency Medicine

## 2018-07-29 DIAGNOSIS — R1032 Left lower quadrant pain: Secondary | ICD-10-CM | POA: Diagnosis present

## 2018-07-29 DIAGNOSIS — Z87891 Personal history of nicotine dependence: Secondary | ICD-10-CM | POA: Diagnosis not present

## 2018-07-29 DIAGNOSIS — R109 Unspecified abdominal pain: Secondary | ICD-10-CM

## 2018-07-29 LAB — WET PREP, GENITAL
Clue Cells Wet Prep HPF POC: NONE SEEN
SPERM: NONE SEEN
Trich, Wet Prep: NONE SEEN
WBC WET PREP: NONE SEEN
Yeast Wet Prep HPF POC: NONE SEEN

## 2018-07-29 LAB — COMPREHENSIVE METABOLIC PANEL
ALK PHOS: 59 U/L (ref 38–126)
ALT: 11 U/L (ref 0–44)
AST: 20 U/L (ref 15–41)
Albumin: 4.5 g/dL (ref 3.5–5.0)
Anion gap: 10 (ref 5–15)
BUN: 12 mg/dL (ref 6–20)
CO2: 26 mmol/L (ref 22–32)
CREATININE: 0.76 mg/dL (ref 0.44–1.00)
Calcium: 9.6 mg/dL (ref 8.9–10.3)
Chloride: 104 mmol/L (ref 98–111)
Glucose, Bld: 104 mg/dL — ABNORMAL HIGH (ref 70–99)
Potassium: 4.3 mmol/L (ref 3.5–5.1)
SODIUM: 140 mmol/L (ref 135–145)
Total Bilirubin: 0.2 mg/dL — ABNORMAL LOW (ref 0.3–1.2)
Total Protein: 8 g/dL (ref 6.5–8.1)

## 2018-07-29 LAB — CBC WITH DIFFERENTIAL/PLATELET
Basophils Absolute: 0 10*3/uL (ref 0.0–0.1)
Basophils Relative: 0 %
EOS ABS: 0 10*3/uL (ref 0.0–0.7)
EOS PCT: 1 %
HCT: 40 % (ref 36.0–46.0)
HEMOGLOBIN: 13.8 g/dL (ref 12.0–15.0)
Lymphocytes Relative: 34 %
Lymphs Abs: 2.2 10*3/uL (ref 0.7–4.0)
MCH: 28.9 pg (ref 26.0–34.0)
MCHC: 34.5 g/dL (ref 30.0–36.0)
MCV: 83.9 fL (ref 78.0–100.0)
MONOS PCT: 4 %
Monocytes Absolute: 0.3 10*3/uL (ref 0.1–1.0)
NEUTROS PCT: 61 %
Neutro Abs: 4 10*3/uL (ref 1.7–7.7)
PLATELETS: 230 10*3/uL (ref 150–400)
RBC: 4.77 MIL/uL (ref 3.87–5.11)
RDW: 14.5 % (ref 11.5–15.5)
WBC: 6.5 10*3/uL (ref 4.0–10.5)

## 2018-07-29 LAB — URINALYSIS, ROUTINE W REFLEX MICROSCOPIC
BILIRUBIN URINE: NEGATIVE
GLUCOSE, UA: NEGATIVE mg/dL
Hgb urine dipstick: NEGATIVE
KETONES UR: NEGATIVE mg/dL
Leukocytes, UA: NEGATIVE
Nitrite: NEGATIVE
PH: 6 (ref 5.0–8.0)
Protein, ur: NEGATIVE mg/dL
Specific Gravity, Urine: 1.02 (ref 1.005–1.030)

## 2018-07-29 LAB — I-STAT BETA HCG BLOOD, ED (MC, WL, AP ONLY): I-stat hCG, quantitative: <5 m[IU]/mL (ref <5)

## 2018-07-29 LAB — HIV ANTIBODY (ROUTINE TESTING W REFLEX): HIV Screen 4th Generation wRfx: NONREACTIVE

## 2018-07-29 LAB — GC/CHLAMYDIA PROBE AMP (~~LOC~~) NOT AT ARMC
CHLAMYDIA, DNA PROBE: NEGATIVE
NEISSERIA GONORRHEA: NEGATIVE

## 2018-07-29 LAB — RPR: RPR Ser Ql: NONREACTIVE

## 2018-07-29 LAB — LIPASE, BLOOD: LIPASE: 31 U/L (ref 11–51)

## 2018-07-29 MED ORDER — TRAMADOL HCL 50 MG PO TABS
50.0000 mg | ORAL_TABLET | Freq: Once | ORAL | Status: AC
  Administered 2018-07-29: 50 mg via ORAL
  Filled 2018-07-29: qty 1

## 2018-07-29 MED ORDER — METHOCARBAMOL 500 MG PO TABS: 500.0000 mg | ORAL_TABLET | Freq: Three times a day (TID) | ORAL | 0 refills | Status: DC | PRN

## 2018-07-29 MED ORDER — ACETAMINOPHEN 325 MG PO TABS
650.0000 mg | ORAL_TABLET | Freq: Once | ORAL | Status: AC
  Administered 2018-07-29: 650 mg via ORAL
  Filled 2018-07-29: qty 2

## 2018-07-29 NOTE — Discharge Instructions (Addendum)
You are seen in the emergency department today for flank pain.  Your work-up in the emergency department was reassuring.  Your labs were all fairly normal.  Your CT scan did not show any abnormalities.  At this time we suspect this is possibly muscle strain/spasm.  We are treating this with Robaxin, this is a muscle relaxer, take this every 8 hours as needed for discomfort.  Do not drive or operate heavy machinery when taking this medicine or drink alcohol with this medicine. We have prescribed you new medication(s) today. Discuss the medications prescribed today with your pharmacist as they can have adverse effects and interactions with your other medicines including over the counter and prescribed medications. Seek medical evaluation if you start to experience new or abnormal symptoms after taking one of these medicines, seek care immediately if you start to experience difficulty breathing, feeling of your throat closing, facial swelling, or rash as these could be indications of a more serious allergic reaction  You may take Tylenol per over-the-counter dosing instructions with this medication safely.  We would like you to follow-up with your primary care provider in 3 to 5 days for reevaluation.  If you do not have a primary care provider you may see our  community clinic or call the phone number circled in your discharge instructions to assist with setting up primary care return to the ER for new or worsening symptoms including but not limited to worsening pain, fever, numbness, weakness, loss of control of your bladder function, burning when you urinate, inability to keep fluids down, or any other concerns.

## 2018-07-29 NOTE — ED Triage Notes (Signed)
Patient here from home with complaints of left sided flank pain. Reports that she was seen here for same but was unable to get meds filled.

## 2018-07-29 NOTE — ED Provider Notes (Signed)
[facility]-EMERGENCY DEPT Provider Note   CSN: [id number] Arrival date & time: 07/29/18  [id number]     History   Chief Complaint Chief Complaint  Patient presents with  . Flank Pain    HPI Erica Vargas is a 22 y.o. female with a hx of former tobacco use who presents to the ED with complaints of left flank pain which has been waxing and waning over the past 1 week.  Patient states pain is located in the left flank and radiates to the left suprapubic area at times.  Her current pain is a 10 out of 10 in severity, this is worse if she twists/turns or walks.  No specific alleviating factors.  She states she had somewhat similar pain to the right side prompting ER visit in June-had negative work-up at that time.  Denies fever, chills, nausea, vomiting, dysuria, hematuria, frequency, urgency, vaginal bleeding, or vaginal discharge.  Patient states she is sexually active in a monogamous relationship and does not have concern for STDs.  HPI  Past Medical History:  Diagnosis Date  . Medical history non-contributory     Patient Active Problem List   Diagnosis Date Noted  . Supervision of normal first pregnancy, antepartum 10/27/2017    Past Surgical History:  Procedure Laterality Date  . NO PAST SURGERIES       OB History    Gravida  2   Para  0   Term  0   Preterm  0   AB  1   Living        SAB  1   TAB  0   Ectopic  0   Multiple      Live Births               Home Medications    Prior to Admission medications   Medication Sig Start Date End Date Taking? Authorizing Provider  methocarbamol (ROBAXIN) 500 MG tablet Take 1 tablet (500 mg total) by mouth 2 (two) times daily. 06/18/18   Joy, Hillard DankerShawn C, PA-C    Family History Family History  Problem Relation Age of Onset  . Sickle cell anemia Mother     Social History Social History   Tobacco Use  . Smoking status: Former Smoker    Types: Cigars  . Smokeless tobacco: Never Used    Substance Use Topics  . Alcohol use: No  . Drug use: No     Allergies   Amoxicillin; Naproxen; and Lidocaine   Review of Systems Review of Systems  Constitutional: Negative for chills and fever.  Respiratory: Negative for shortness of breath.   Cardiovascular: Negative for chest pain.  Gastrointestinal: Positive for abdominal pain. Negative for blood in stool, constipation, diarrhea, nausea and vomiting.  Genitourinary: Positive for flank pain. Negative for dysuria, frequency, hematuria, urgency, vaginal bleeding and vaginal discharge.  All other systems reviewed and are negative.    Physical Exam Updated Vital Signs BP 101/66 (BP Location: Left Arm)   Pulse 73   Temp 98.7 F (37.1 C) (Oral)   Resp 15   LMP 05/31/2018   SpO2 99%   Physical Exam  Constitutional: She appears well-developed and well-nourished. No distress.  HENT:  Head: Normocephalic and atraumatic.  Eyes: Conjunctivae are normal. Right eye exhibits no discharge. Left eye exhibits no discharge.  Cardiovascular: Normal rate and regular rhythm.  No murmur heard. Pulmonary/Chest: Breath sounds normal. No respiratory distress. She has no wheezes. She has no rales.  Abdominal: Soft.  She exhibits no distension. There is tenderness in the left lower quadrant. There is CVA tenderness (L). There is no rigidity, no rebound, no guarding, no tenderness at McBurney's point and negative Murphy's sign.  Genitourinary: Pelvic exam was performed with patient supine. There is no rash or tenderness on the right labia. There is no rash or tenderness on the left labia. Cervix exhibits no motion tenderness and no friability. Right adnexum displays no mass, no tenderness and no fullness. Left adnexum displays no mass, no tenderness and no fullness. No erythema, tenderness or bleeding in the vagina. No foreign body in the vagina. Vaginal discharge (white ) found.  Genitourinary Comments: EDT present as chaperone.   Musculoskeletal:   No obvious deformity, appreciable swelling, erythema, ecchymosis, or open wounds. Back: No midline tenderness to palpation.  Patient has left-sided lumbar paraspinal muscle tenderness to palpation.  Neurological: She is alert.  Clear speech.  Per the 5 strength plantar dorsiflexion bilaterally.  Sensation grossly intact bilateral lower extremities.  Patellar DTRs are 2+ and symmetric.  Gait is intact.  Skin: Skin is warm and dry. No rash noted.  Psychiatric: She has a normal mood and affect. Her behavior is normal.  Nursing note and vitals reviewed.   ED Treatments / Results  Labs Results for orders placed or performed during the hospital encounter of 07/29/18  Wet prep, genital  Result Value Ref Range   Yeast Wet Prep HPF POC NONE SEEN NONE SEEN   Trich, Wet Prep NONE SEEN NONE SEEN   Clue Cells Wet Prep HPF POC NONE SEEN NONE SEEN   WBC, Wet Prep HPF POC NONE SEEN NONE SEEN   Sperm NONE SEEN   Urinalysis, Routine w reflex microscopic- may I&O cath if menses  Result Value Ref Range   Color, Urine YELLOW YELLOW   APPearance CLEAR CLEAR   Specific Gravity, Urine 1.020 1.005 - 1.030   pH 6.0 5.0 - 8.0   Glucose, UA NEGATIVE NEGATIVE mg/dL   Hgb urine dipstick NEGATIVE NEGATIVE   Bilirubin Urine NEGATIVE NEGATIVE   Ketones, ur NEGATIVE NEGATIVE mg/dL   Protein, ur NEGATIVE NEGATIVE mg/dL   Nitrite NEGATIVE NEGATIVE   Leukocytes, UA NEGATIVE NEGATIVE  Comprehensive metabolic panel  Result Value Ref Range   Sodium 140 135 - 145 mmol/L   Potassium 4.3 3.5 - 5.1 mmol/L   Chloride 104 98 - 111 mmol/L   CO2 26 22 - 32 mmol/L   Glucose, Bld 104 (H) 70 - 99 mg/dL   BUN 12 6 - 20 mg/dL   Creatinine, Ser 8.41 0.44 - 1.00 mg/dL   Calcium 9.6 8.9 - 32.4 mg/dL   Total Protein 8.0 6.5 - 8.1 g/dL   Albumin 4.5 3.5 - 5.0 g/dL   AST 20 15 - 41 U/L   ALT 11 0 - 44 U/L   Alkaline Phosphatase 59 38 - 126 U/L   Total Bilirubin 0.2 (L) 0.3 - 1.2 mg/dL   GFR calc non Af Amer >60 >60 mL/min    GFR calc Af Amer >60 >60 mL/min   Anion gap 10 5 - 15  CBC with Differential  Result Value Ref Range   WBC 6.5 4.0 - 10.5 K/uL   RBC 4.77 3.87 - 5.11 MIL/uL   Hemoglobin 13.8 12.0 - 15.0 g/dL   HCT 40.1 02.7 - 25.3 %   MCV 83.9 78.0 - 100.0 fL   MCH 28.9 26.0 - 34.0 pg   MCHC 34.5 30.0 - 36.0 g/dL  RDW 14.5 11.5 - 15.5 %   Platelets 230 150 - 400 K/uL   Neutrophils Relative % 61 %   Neutro Abs 4.0 1.7 - 7.7 K/uL   Lymphocytes Relative 34 %   Lymphs Abs 2.2 0.7 - 4.0 K/uL   Monocytes Relative 4 %   Monocytes Absolute 0.3 0.1 - 1.0 K/uL   Eosinophils Relative 1 %   Eosinophils Absolute 0.0 0.0 - 0.7 K/uL   Basophils Relative 0 %   Basophils Absolute 0.0 0.0 - 0.1 K/uL  Lipase, blood  Result Value Ref Range   Lipase 31 11 - 51 U/L  I-Stat beta hCG blood, ED  Result Value Ref Range   I-stat hCG, quantitative <5.0 <5 mIU/mL   Comment 3            EKG None  Radiology Ct Renal Stone Study  Result Date: 07/29/2018 CLINICAL DATA:  LEFT side flank pain for 2 weeks, former smoker, no history of kidney stones EXAM: CT ABDOMEN AND PELVIS WITHOUT CONTRAST TECHNIQUE: Multidetector CT imaging of the abdomen and pelvis was performed following the standard protocol without IV contrast. Sagittal and coronal MPR images reconstructed from axial data set. Oral contrast was not administered for this indication. COMPARISON:  06/17/2018 FINDINGS: Lower chest: Lung bases clear Hepatobiliary: Gallbladder and liver normal appearance Pancreas: Normal appearance Spleen: Normal appearance.  Two small splenules. Adrenals/Urinary Tract: Adrenal glands normal appearance. Kidneys, ureters, and bladder normal appearance Stomach/Bowel: Normal appendix. Stomach and bowel loops unremarkable for technique. Vascular/Lymphatic: Vascular structures unremarkable. No adenopathy. Reproductive: Unremarkable uterus and adnexa Other: No free air or free fluid. No hernia or acute inflammatory process. Musculoskeletal: Osseous  structures unremarkable. IMPRESSION: Normal exam. Electronically Signed   By: Ulyses Southward M.D.   On: 07/29/2018 09:09    Procedures Procedures (including critical care time)  Medications Ordered in ED Medications  acetaminophen (TYLENOL) tablet 650 mg (650 mg Oral Given 07/29/18 0831)  traMADol (ULTRAM) tablet 50 mg (50 mg Oral Given 07/29/18 0949)     Initial Impression / Assessment and Plan / ED Course  I have reviewed the triage vital signs and the nursing notes.  Pertinent labs & imaging results that were available during my care of the patient were reviewed by me and considered in my medical decision making (see chart for details).   Patient presents to the emergency department with complaints of right flank pain.  Patient nontoxic-appearing, resting comfortably.  She does have some tenderness to the left CVA area, left lumbar paraspinal muscles, as well as the left lower quadrant of the abdomen.  Pelvic exam performed, mild amount of white discharge, no cervical motion tenderness or adnexal tenderness.  Will evaluate with lab work and CT renal study for further evaluation given new location of pain compared to prior visits.  Tylenol for pain.  Work up reviewed and grossly unremarkable.  No leukocytosis.  No anemia.  No significant electrolyte abnormalities.  LFTs, lipase, and renal function are WNL.  Urinalysis without signs of infection to raise concern for UTI or pyelonephritis.  Wet prep unremarkable, no CMT or adnexal tenderness, patient without concern for STDs, doubt PID.  Pregnancy test is negative, doubt ectopic pregnancy.  CT renal study is unremarkable, no nephrolithiasis or other acute findings. On repeat exam patient remains without surgical abdomen, doubt cholecystitis, pancreatitis, bowel obstruction/perforation, appendicitis, PID, ectopic pregnancy, or ovarian torsion.  At this time suspect muscle strain/spasm, no back pain red flags, will treat with Robaxin, NSAIDs avoided  given  patient's allergy.  She has had multiple ER visits recently with somewhat similar complaints but different locations, I stressed the importance of finding a primary care provider, resources were provided for this. I discussed results, treatment plan, need for PCP follow-up, and return precautions with the patient. Provided opportunity for questions, patient confirmed understanding and is in agreement with plan.    Final Clinical Impressions(s) / ED Diagnoses   Final diagnoses:  Flank pain    ED Discharge Orders        Ordered    methocarbamol (ROBAXIN) 500 MG tablet  Every 8 hours PRN     07/29/18 0935       Cherly Anderson, PA-C 07/29/18 1221    Wynetta Fines, MD 08/02/18 [phone]

## 2018-11-18 ENCOUNTER — Emergency Department (HOSPITAL_COMMUNITY)
Admission: EM | Admit: 2018-11-18 | Discharge: 2018-11-18 | Disposition: A | Discharge status: Home / Self Care | Payer: Medicaid Other | Attending: Emergency Medicine | Admitting: Emergency Medicine

## 2018-11-18 ENCOUNTER — Encounter (HOSPITAL_COMMUNITY): Payer: Self-pay

## 2018-11-18 DIAGNOSIS — R197 Diarrhea, unspecified: Secondary | ICD-10-CM | POA: Diagnosis not present

## 2018-11-18 DIAGNOSIS — R112 Nausea with vomiting, unspecified: Secondary | ICD-10-CM | POA: Insufficient documentation

## 2018-11-18 DIAGNOSIS — R1084 Generalized abdominal pain: Secondary | ICD-10-CM | POA: Diagnosis present

## 2018-11-18 LAB — CBC
HCT: 37.9 % (ref 36.0–46.0)
HEMOGLOBIN: 13.1 g/dL (ref 12.0–15.0)
MCH: 29.2 pg (ref 26.0–34.0)
MCHC: 34.6 g/dL (ref 30.0–36.0)
MCV: 84.4 fL (ref 80.0–100.0)
NRBC: 0 % (ref 0.0–0.2)
Platelets: 246 10*3/uL (ref 150–400)
RBC: 4.49 MIL/uL (ref 3.87–5.11)
RDW: 13.9 % (ref 11.5–15.5)
WBC: 7.1 10*3/uL (ref 4.0–10.5)

## 2018-11-18 LAB — COMPREHENSIVE METABOLIC PANEL
ALBUMIN: 4.4 g/dL (ref 3.5–5.0)
ALT: 10 U/L (ref 0–44)
ANION GAP: 8 (ref 5–15)
AST: 16 U/L (ref 15–41)
Alkaline Phosphatase: 53 U/L (ref 38–126)
BUN: 11 mg/dL (ref 6–20)
CO2: 27 mmol/L (ref 22–32)
Calcium: 9.4 mg/dL (ref 8.9–10.3)
Chloride: 106 mmol/L (ref 98–111)
Creatinine, Ser: 0.86 mg/dL (ref 0.44–1.00)
GFR calc Af Amer: >60 mL/min (ref >60)
GFR calc non Af Amer: >60 mL/min (ref >60)
GLUCOSE: 97 mg/dL (ref 70–99)
POTASSIUM: 3.9 mmol/L (ref 3.5–5.1)
SODIUM: 141 mmol/L (ref 135–145)
TOTAL PROTEIN: 7.9 g/dL (ref 6.5–8.1)
Total Bilirubin: 0.8 mg/dL (ref 0.3–1.2)

## 2018-11-18 LAB — URINALYSIS, ROUTINE W REFLEX MICROSCOPIC
Bilirubin Urine: NEGATIVE
GLUCOSE, UA: NEGATIVE mg/dL
Hgb urine dipstick: NEGATIVE
Ketones, ur: 5 mg/dL — AB
Nitrite: NEGATIVE
PH: 6 (ref 5.0–8.0)
Protein, ur: NEGATIVE mg/dL
Specific Gravity, Urine: 1.029 (ref 1.005–1.030)

## 2018-11-18 LAB — I-STAT BETA HCG BLOOD, ED (MC, WL, AP ONLY): I-stat hCG, quantitative: 11.5 m[IU]/mL — ABNORMAL HIGH (ref <5)

## 2018-11-18 LAB — PREGNANCY, URINE: PREG TEST UR: NEGATIVE

## 2018-11-18 LAB — LIPASE, BLOOD: Lipase: 31 U/L (ref 11–51)

## 2018-11-18 MED ORDER — ONDANSETRON HCL 4 MG/2ML IJ SOLN
4.0000 mg | Freq: Once | INTRAMUSCULAR | Status: AC
  Administered 2018-11-18: 4 mg via INTRAVENOUS
  Filled 2018-11-18: qty 2

## 2018-11-18 MED ORDER — ONDANSETRON HCL 4 MG PO TABS: 4.0000 mg | ORAL_TABLET | Freq: Four times a day (QID) | ORAL | 0 refills | Status: DC

## 2018-11-18 MED ORDER — DICYCLOMINE HCL 10 MG PO CAPS
10.0000 mg | ORAL_CAPSULE | Freq: Once | ORAL | Status: AC
  Administered 2018-11-18: 10 mg via ORAL
  Filled 2018-11-18: qty 1

## 2018-11-18 MED ORDER — DICYCLOMINE HCL 20 MG PO TABS: 20.0000 mg | ORAL_TABLET | Freq: Two times a day (BID) | ORAL | 0 refills | Status: DC

## 2018-11-18 MED ORDER — SODIUM CHLORIDE 0.9 % IV SOLN
Freq: Once | INTRAVENOUS | Status: AC
  Administered 2018-11-18: 11:00:00 via INTRAVENOUS

## 2018-11-18 NOTE — ED Notes (Signed)
Pt wants to know when EDP will come in to see her, states that her IV  fluids are complete and would like to know "what the plan is"

## 2018-11-18 NOTE — Discharge Instructions (Signed)
You were seen today for vomiting and diarrhea.  Please take Zofran as needed for nausea.  Take Bentyl as needed for abdominal cramping.  Please return to the ED immediately for new or worsening symptoms, such as new or worsening abdominal pain, fevers, uncontrolled vomiting, blood in the stool or vomit or any concerns at all.

## 2018-11-18 NOTE — ED Provider Notes (Signed)
[facility]-EMERGENCY DEPT Provider Note   CSN: [id number] Arrival date & time: 11/18/18  1021     History   Chief Complaint Chief Complaint  Patient presents with  . Abdominal Pain    HPI Erica Vargas is a 22 y.o. female.  HPI  22 year old female presents with a 2-day history of nausea, vomiting and diarrhea.  She states partially 2 episodes of 4 episodes of diarrhea.  She describes the diarrhea as a loose stool.  She notes associated abdominal aching which is diffuse.  She denies any hematemesis, hematochezia, melena.  She denies any fevers, dysuria, urinary urgency, vaginal bleeding, vaginal discharge.  Past Medical History:  Diagnosis Date  . Medical history non-contributory     Patient Active Problem List   Diagnosis Date Noted  . Supervision of normal first pregnancy, antepartum 10/27/2017    Past Surgical History:  Procedure Laterality Date  . NO PAST SURGERIES       OB History    Gravida  2   Para  0   Term  0   Preterm  0   AB  1   Living        SAB  1   TAB  0   Ectopic  0   Multiple      Live Births               Home Medications    Prior to Admission medications   Medication Sig Start Date End Date Taking? Authorizing Provider  methocarbamol (ROBAXIN) 500 MG tablet Take 1 tablet (500 mg total) by mouth every 8 (eight) hours as needed for muscle spasms. Patient not taking: Reported on 11/18/2018 07/29/18   Petrucelli, Pleas Koch, PA-C    Family History Family History  Problem Relation Age of Onset  . Sickle cell anemia Mother     Social History Social History   Tobacco Use  . Smoking status: Former Smoker    Types: Cigars  . Smokeless tobacco: Never Used  Substance Use Topics  . Alcohol use: No  . Drug use: No     Allergies   Amoxicillin; Naproxen; and Lidocaine   Review of Systems Review of Systems  Constitutional: Negative for chills and fever.  Respiratory: Negative for shortness  of breath.   Cardiovascular: Negative for chest pain.  Gastrointestinal: Positive for abdominal pain, diarrhea, nausea and vomiting.  Genitourinary: Negative for dysuria, vaginal bleeding and vaginal discharge.     Physical Exam Updated Vital Signs BP 111/71 (BP Location: Right Arm)   Pulse 85   Temp 99 F (37.2 C) (Oral)   Resp 18   Ht 5\' 4"  (1.626 m)   Wt 56.7 kg   LMP 10/14/2018   SpO2 99%   BMI 21.46 kg/m   Physical Exam  Constitutional: She is oriented to person, place, and time. She appears well-developed and well-nourished.  HENT:  Head: Normocephalic and atraumatic.  Eyes: Conjunctivae and EOM are normal.  Neck: Neck supple.  Cardiovascular: Normal rate, regular rhythm and normal heart sounds.  No murmur heard. Pulmonary/Chest: Effort normal and breath sounds normal. No respiratory distress. She has no wheezes. She has no rales.  Abdominal: Soft. Bowel sounds are normal. She exhibits no distension. There is no tenderness. There is no rigidity, no guarding, no tenderness at McBurney's point and negative Murphy's sign.  Musculoskeletal: Normal range of motion. She exhibits no tenderness or deformity.  Neurological: She is alert and oriented to person, place, and time.  Skin: Skin is warm and dry. No rash noted. No erythema.  Psychiatric: She has a normal mood and affect. Her behavior is normal.  Nursing note and vitals reviewed.    ED Treatments / Results  Labs (all labs ordered are listed, but only abnormal results are displayed) Labs Reviewed  CBC  LIPASE, BLOOD  COMPREHENSIVE METABOLIC PANEL  URINALYSIS, ROUTINE W REFLEX MICROSCOPIC  I-STAT BETA HCG BLOOD, ED ([facility], [facility], [facility] ONLY)    EKG None  Radiology No results found.  Procedures Procedures (including critical care time)  Medications Ordered in ED Medications  0.9 %  sodium chloride infusion ( Intravenous New Bag/Given 11/18/18 1125)     Initial Impression / Assessment and Plan / ED Course  I  have reviewed the triage vital signs and the nursing notes.  Pertinent labs & imaging results that were available during my care of the patient were reviewed by me and considered in my medical decision making (see chart for details).  Clinical Course as of Nov 18 1330  Wed Nov 18, 2018  1245 I-stat slightly elevated however urine preg is negative. Will proceed with zofran and bentyl   [CK]    Clinical Course User Index [CK] Treyvion Durkee S, PA-C    1:31 PM Resting comfortably in bed, no acute distress, nontoxic, non-lethargic, vital signs stable.  Abdomen soft and nontender palpation.  Patient denies any abdominal pain at this time.  She states she is feeling much better after Zofran and Bentyl.  She is p.o. tolerant to water.  Blood work unremarkable.  Urine pregnancy is negative.  Urine hazy with some leukocyte esterase and bacteria.  However patient denies any dysuria, urinary urgency, urinary frequency, suprapubic abdominal pain.  Will send for culture.  Will write for Zofran and Bentyl for home.  Patient given strict return precautions.  She expresses understanding and is ready and agreeable for discharge.   At this time there does not appear to be any evidence of an acute emergency medical condition and the patient appears stable for discharge with appropriate outpatient follow up.Diagnosis was discussed with patient who verbalizes understanding and is agreeable to discharge. Pt case discussed with Dr. Ranae PalmsYelverton who agrees with my plan.   Final Clinical Impressions(s) / ED Diagnoses   Final diagnoses:  None    ED Discharge Orders    None       Clayborne ArtistKendrick, Dameon Soltis S, PA-C 11/19/18 0700    Loren RacerYelverton, David, MD 11/21/18 [phone]

## 2018-11-18 NOTE — ED Triage Notes (Signed)
Patient arrived POV. Patient was sent home at work due to vomiting the past 2 days. Patient has vomited 4X in past 24 hours. Denies diarrhea. Patient c/o of generalized abd. Pain x2 days.   4/10 abd.Pain (achy) A/OX4 Ambulatory in triage.

## 2018-11-18 NOTE — ED Notes (Signed)
Urine culture sent with UA 

## 2018-11-20 LAB — URINE CULTURE: Culture: 30000 — AB

## 2019-01-19 ENCOUNTER — Encounter (HOSPITAL_COMMUNITY): Payer: Self-pay

## 2019-01-19 ENCOUNTER — Ambulatory Visit (HOSPITAL_COMMUNITY)
Admission: EM | Admit: 2019-01-19 | Discharge: 2019-01-19 | Disposition: A | Discharge status: Home / Self Care | Payer: Medicaid Other | Attending: Family Medicine | Admitting: Family Medicine

## 2019-01-19 DIAGNOSIS — J069 Acute upper respiratory infection, unspecified: Secondary | ICD-10-CM | POA: Insufficient documentation

## 2019-01-19 DIAGNOSIS — B9789 Other viral agents as the cause of diseases classified elsewhere: Secondary | ICD-10-CM | POA: Diagnosis present

## 2019-01-19 LAB — POCT RAPID STREP A: Streptococcus, Group A Screen (Direct): NEGATIVE

## 2019-01-19 MED ORDER — FLUTICASONE PROPIONATE 50 MCG/ACT NA SUSP: 1.0000 | Freq: Every day | NASAL | 0 refills | Status: DC

## 2019-01-19 MED ORDER — CETIRIZINE HCL 10 MG PO CAPS: 10.0000 mg | ORAL_CAPSULE | Freq: Every day | ORAL | 0 refills | Status: AC

## 2019-01-19 MED ORDER — PSEUDOEPH-BROMPHEN-DM 30-2-10 MG/5ML PO SYRP: 5.0000 mL | ORAL_SOLUTION | Freq: Four times a day (QID) | ORAL | 0 refills | Status: DC | PRN

## 2019-01-19 NOTE — Discharge Instructions (Signed)
Sore Throat  Your rapid strep tested Negative today. We will send for a culture and call in about 2 days if results are positive. For now we will treat your sore throat as a virus with symptom management.   Cetirizine and flonase for congestion Cough syrup as needed  Please continue Tylenol or Ibuprofen for fever and pain. May try salt water gargles, cepacol lozenges, throat spray, or OTC cold relief medicine for throat discomfort. If you also have congestion take a daily anti-histamine like Zyrtec, Claritin, and a oral decongestant to help with post nasal drip that may be irritating your throat.   Stay hydrated and drink plenty of fluids to keep your throat coated relieve irritation.

## 2019-01-19 NOTE — ED Triage Notes (Signed)
Pt presents with sore throat since yesterday.  

## 2019-01-19 NOTE — ED Provider Notes (Signed)
[facility]    CSN: [id number] Arrival date & time: 01/19/19  1425     History   Chief Complaint Chief Complaint  Patient presents with  . Sore Throat    HPI Erica Vargas is a 23 y.o. female no contributing past medical history presenting today for evaluation of a sore throat.  Patient states that she began develop a sore throat last night.  She has had a lot of pain with swallowing.  She is also had a cough and has felt stopped up.  Denies rhinorrhea.  She denies any fevers.  She is tried some Robitussin over-the-counter without relief.  Denies any nausea or vomiting.  HPI  Past Medical History:  Diagnosis Date  . Medical history non-contributory     Patient Active Problem List   Diagnosis Date Noted  . Supervision of normal first pregnancy, antepartum 10/27/2017    Past Surgical History:  Procedure Laterality Date  . NO PAST SURGERIES      OB History    Gravida  2   Para  0   Term  0   Preterm  0   AB  1   Living        SAB  1   TAB  0   Ectopic  0   Multiple      Live Births               Home Medications    Prior to Admission medications   Medication Sig Start Date End Date Taking? Authorizing Provider  brompheniramine-pseudoephedrine-DM 30-2-10 MG/5ML syrup Take 5 mLs by mouth 4 (four) times daily as needed. 01/19/19   ,  C, PA-C  Cetirizine HCl 10 MG CAPS Take 1 capsule (10 mg total) by mouth daily for 10 days. 01/19/19 01/29/19  ,  C, PA-C  dicyclomine (BENTYL) 20 MG tablet Take 1 tablet (20 mg total) by mouth 2 (two) times daily. 11/18/18   Kendrick, Caitlyn S, PA-C  fluticasone (FLONASE) 50 MCG/ACT nasal spray Place 1-2 sprays into both nostrils daily for 7 days. 01/19/19 01/26/19  ,  C, PA-C  ondansetron (ZOFRAN) 4 MG tablet Take 1 tablet (4 mg total) by mouth every 6 (six) hours. 11/18/18   Clayborne Artist, PA-C    Family History Family History  Problem Relation Age of Onset    . Sickle cell anemia Mother     Social History Social History   Tobacco Use  . Smoking status: Former Smoker    Types: Cigars  . Smokeless tobacco: Never Used  Substance Use Topics  . Alcohol use: No  . Drug use: No     Allergies   Amoxicillin; Naproxen; and Lidocaine   Review of Systems Review of Systems  Constitutional: Negative for activity change, appetite change, chills, fatigue and fever.  HENT: Positive for congestion and sore throat. Negative for ear pain, rhinorrhea, sinus pressure and trouble swallowing.   Eyes: Negative for discharge and redness.  Respiratory: Positive for cough. Negative for chest tightness and shortness of breath.   Cardiovascular: Negative for chest pain.  Gastrointestinal: Negative for abdominal pain, diarrhea, nausea and vomiting.  Musculoskeletal: Negative for myalgias.  Skin: Negative for rash.  Neurological: Negative for dizziness, light-headedness and headaches.     Physical Exam Triage Vital Signs ED Triage Vitals [01/19/19 1443]  Enc Vitals Group     BP 112/78     Pulse Rate 86     Resp 20     Temp 98.3  F (36.8 C)     Temp Source Oral     SpO2 100 %     Weight      Height      Head Circumference      Peak Flow      Pain Score 7     Pain Loc      Pain Edu?      Excl. in GC?    No data found.  Updated Vital Signs BP 112/78 (BP Location: Left Arm)   Pulse 86   Temp 98.3 F (36.8 C) (Oral)   Resp 20   SpO2 100%   Visual Acuity Right Eye Distance:   Left Eye Distance:   Bilateral Distance:    Right Eye Near:   Left Eye Near:    Bilateral Near:     Physical Exam Vitals signs and nursing note reviewed.  Constitutional:      General: She is not in acute distress.    Appearance: She is well-developed.  HENT:     Head: Normocephalic and atraumatic.     Ears:     Comments: Bilateral ears without tenderness to palpation of external auricle, tragus and mastoid, EAC's without erythema or swelling, TM's with  good bony landmarks and cone of light. Non erythematous.     Nose:     Comments: Nasal mucosa erythematous, swollen mucosa    Mouth/Throat:     Comments: Oral mucosa pink and moist, no tonsillar enlargement or exudate. Posterior pharynx patent and nonerythematous, no uvula deviation or swelling. Normal phonation.  Eyes:     Conjunctiva/sclera: Conjunctivae normal.  Neck:     Musculoskeletal: Neck supple.  Cardiovascular:     Rate and Rhythm: Normal rate and regular rhythm.     Heart sounds: No murmur.  Pulmonary:     Effort: Pulmonary effort is normal. No respiratory distress.     Breath sounds: Normal breath sounds.     Comments: Breathing comfortably at rest, CTABL, no wheezing, rales or other adventitious sounds auscultated Abdominal:     Palpations: Abdomen is soft.     Tenderness: There is no abdominal tenderness.  Skin:    General: Skin is warm and dry.  Neurological:     Mental Status: She is alert.      UC Treatments / Results  Labs (all labs ordered are listed, but only abnormal results are displayed) Labs Reviewed  POCT RAPID STREP A    EKG None  Radiology No results found.  Procedures Procedures (including critical care time)  Medications Ordered in UC Medications - No data to display  Initial Impression / Assessment and Plan / UC Course  I have reviewed the triage vital signs and the nursing notes.  Pertinent labs & imaging results that were available during my care of the patient were reviewed by me and considered in my medical decision making (see chart for details).     Strep test negative, symptoms most likely viral, sore throat likely secondary to drainage and cough.  Will recommend symptomatic and supportive care.  Recommendations below.  Continue to monitor,Discussed strict return precautions. Patient verbalized understanding and is agreeable with plan.  Final Clinical Impressions(s) / UC Diagnoses   Final diagnoses:  Viral URI with cough      Discharge Instructions     Sore Throat  Your rapid strep tested Negative today. We will send for a culture and call in about 2 days if results are positive. For now we will treat your  sore throat as a virus with symptom management.   Cetirizine and flonase for congestion Cough syrup as needed  Please continue Tylenol or Ibuprofen for fever and pain. May try salt water gargles, cepacol lozenges, throat spray, or OTC cold relief medicine for throat discomfort. If you also have congestion take a daily anti-histamine like Zyrtec, Claritin, and a oral decongestant to help with post nasal drip that may be irritating your throat.   Stay hydrated and drink plenty of fluids to keep your throat coated relieve irritation.    ED Prescriptions    Medication Sig Dispense Auth. Provider   Cetirizine HCl 10 MG CAPS Take 1 capsule (10 mg total) by mouth daily for 10 days. 10 capsule ,  C, PA-C   fluticasone (FLONASE) 50 MCG/ACT nasal spray Place 1-2 sprays into both nostrils daily for 7 days. 1 g ,  C, PA-C   brompheniramine-pseudoephedrine-DM 30-2-10 MG/5ML syrup Take 5 mLs by mouth 4 (four) times daily as needed. 120 mL ,  C, PA-C     Controlled Substance Prescriptions [place] Controlled Substance Registry consulted? Not Applicable   Lew Dawes, [place] 01/19/19 [phone]

## 2019-01-21 LAB — CULTURE, GROUP A STREP (THRC)

## 2019-02-05 IMAGING — CT CT RENAL STONE PROTOCOL
2 of 4 series · 17 of 46 positions shown, 19 images · non-contrast
Comparison: 06/17/2018

CLINICAL DATA: LEFT side flank pain for 2 weeks, former smoker, no
history of kidney stones

EXAM:
CT ABDOMEN AND PELVIS WITHOUT CONTRAST
TECHNIQUE: Multidetector CT imaging of the abdomen and pelvis was performed
following the standard protocol without IV contrast. Sagittal and
coronal MPR images reconstructed from axial data set. Oral contrast
was not administered for this indication.

[Series 2: axial st · axial · 0.66mm/px · z∈[+1598,+1973]mm · 14 of 85 slices shown, 16 images]
[im 5/85  soft-tissue]
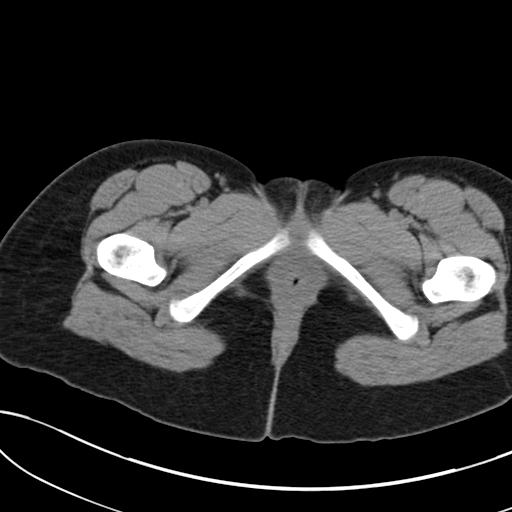
[im 5/85  bone]
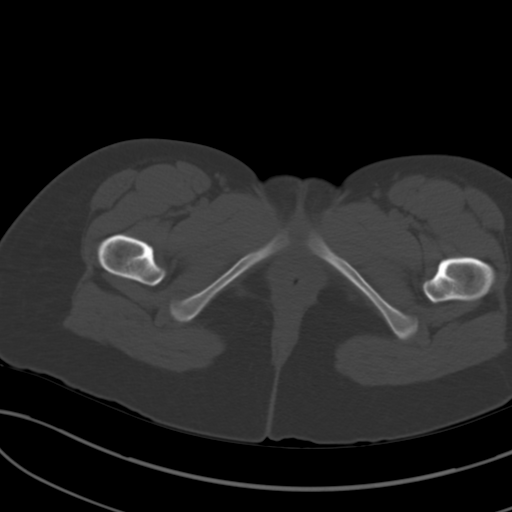
[im 10/85  soft-tissue]
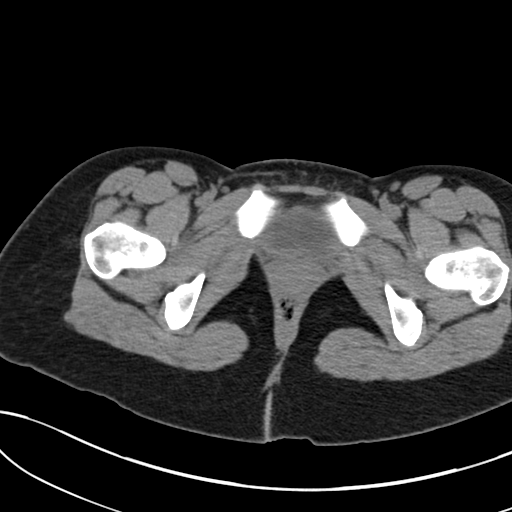
[im 19/85  soft-tissue]
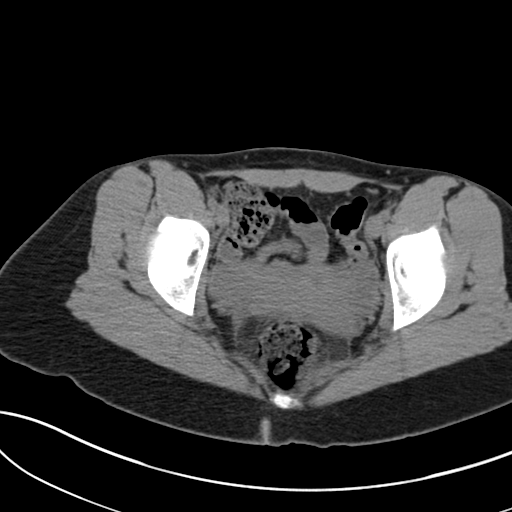
[im 24/85  soft-tissue]
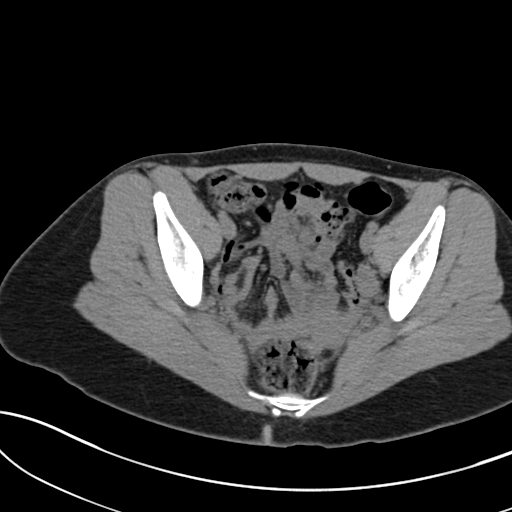
[im 29/85  soft-tissue]
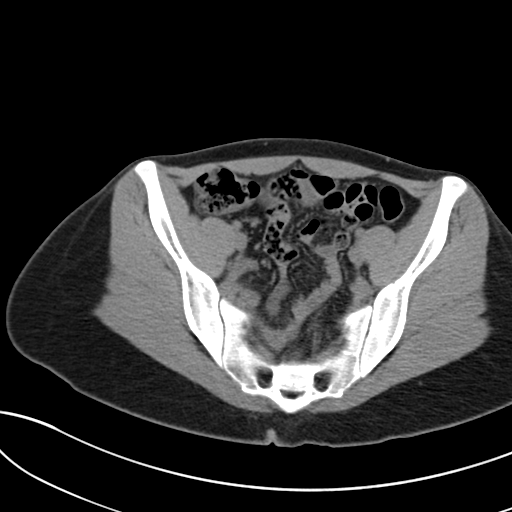
[im 33/85  soft-tissue]
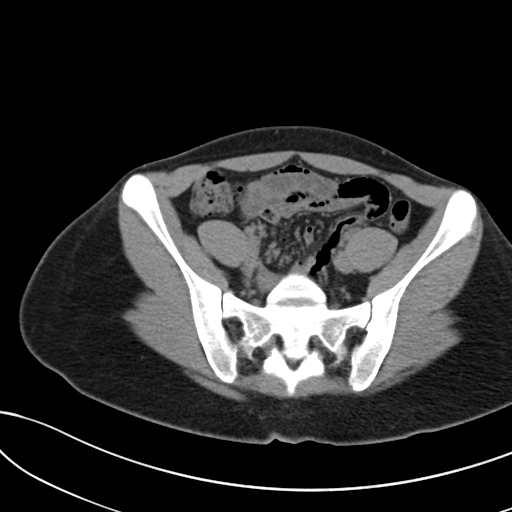
[im 38/85  soft-tissue]
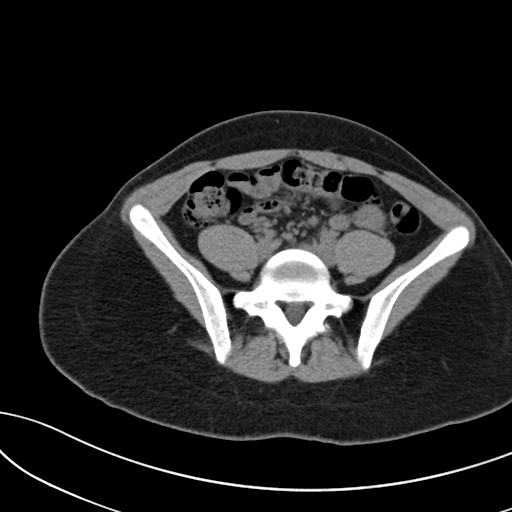
[im 47/85  soft-tissue]
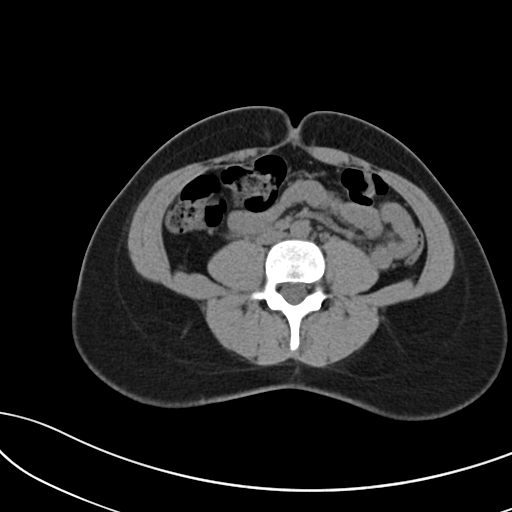
[im 52/85  soft-tissue]
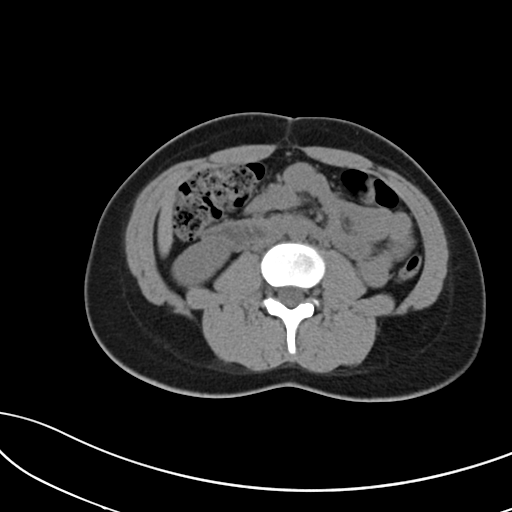
[im 52/85  bone]
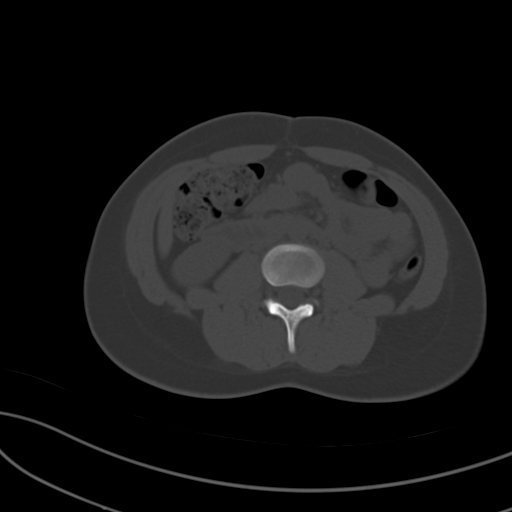
[im 57/85  soft-tissue]
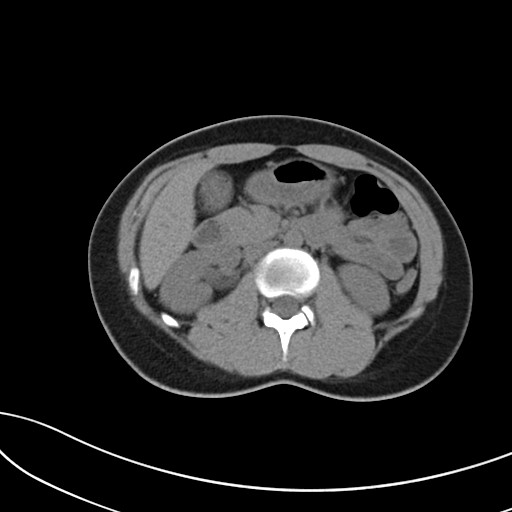
[im 61/85  soft-tissue]
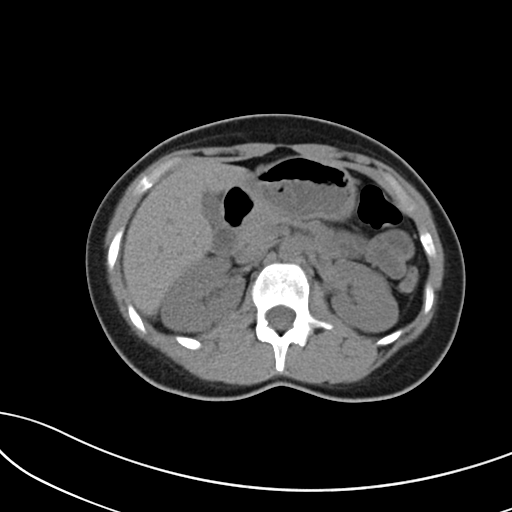
[im 66/85  soft-tissue]
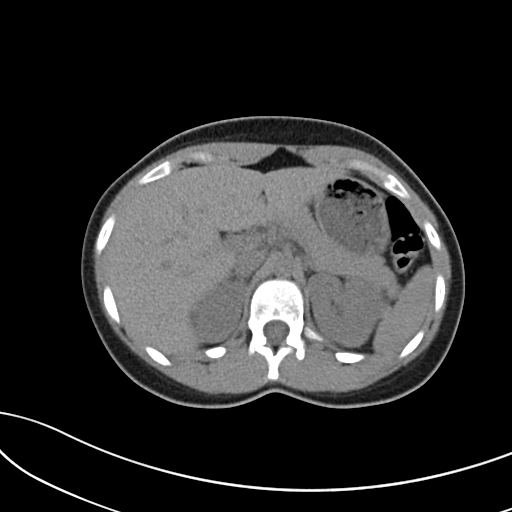
[im 75/85  soft-tissue]
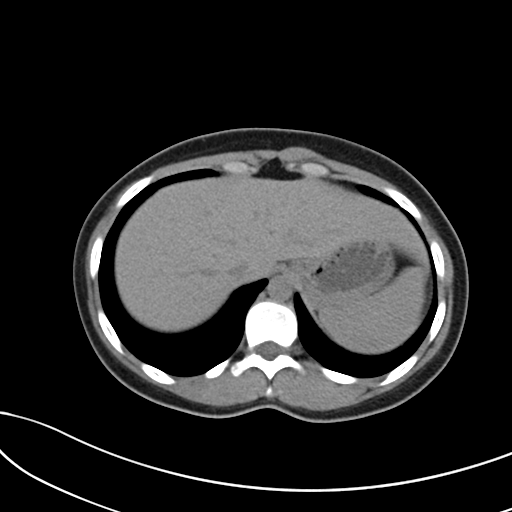
[im 80/85  soft-tissue]
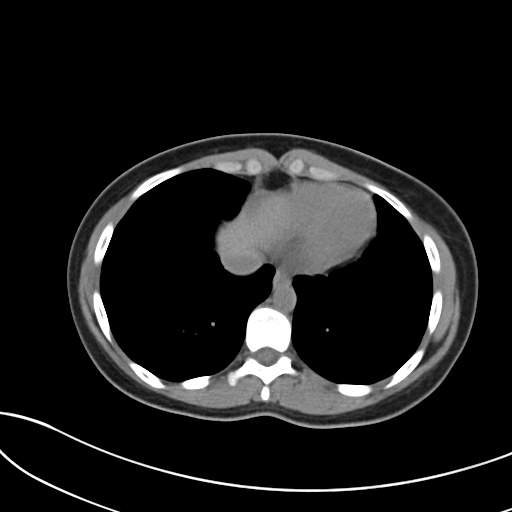

[Series 5: coronal · coronal · 0.70mm/px · 3 of 110 slices shown]
[im 37/110  soft-tissue]
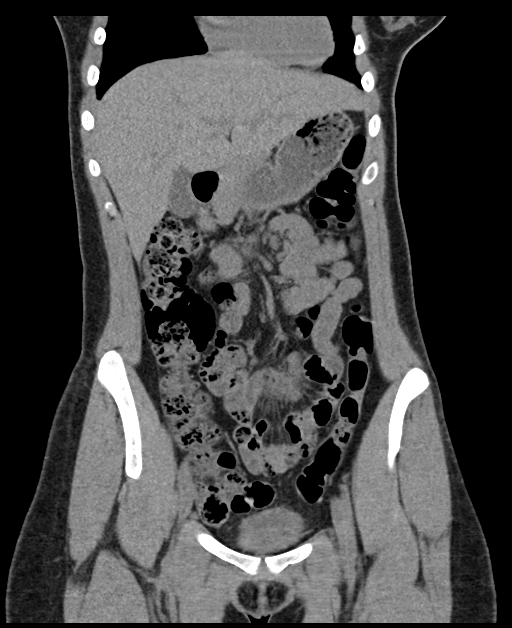
[im 49/110  soft-tissue]
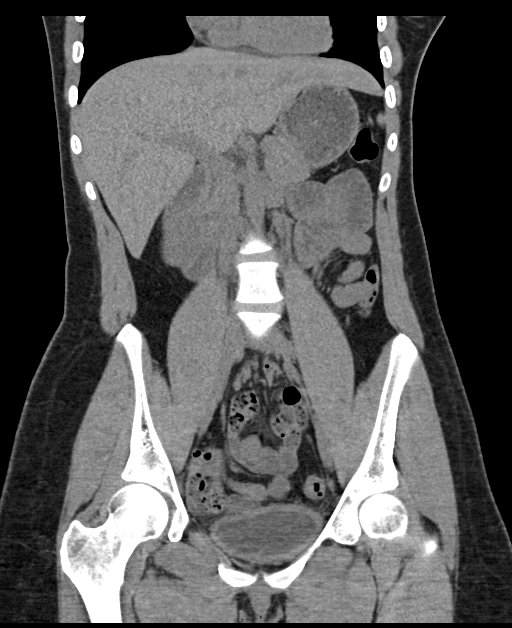
[im 61/110  soft-tissue]
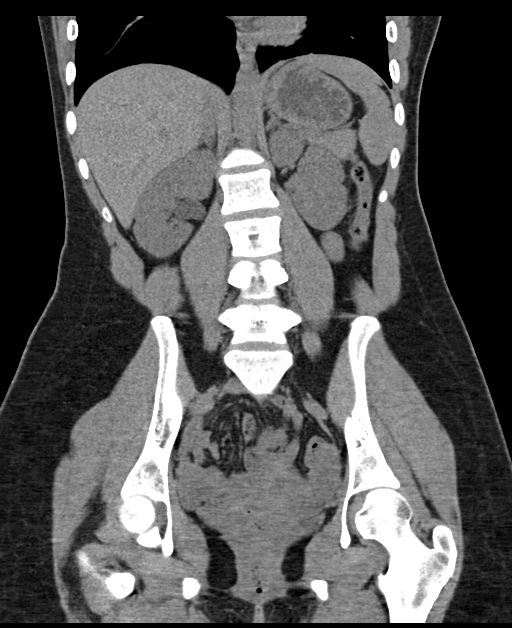

[17 of 46 positions shown; findings below may reference images not displayed]

FINDINGS: Lower chest: Lung bases clear

Hepatobiliary: Gallbladder and liver normal appearance

Pancreas: Normal appearance

Spleen: Normal appearance.  Two small splenules.

Adrenals/Urinary Tract: Adrenal glands normal appearance. Kidneys,
ureters, and bladder normal appearance

Stomach/Bowel: Normal appendix. Stomach and bowel loops unremarkable
for technique.

Vascular/Lymphatic: Vascular structures unremarkable. No adenopathy.

Reproductive: Unremarkable uterus and adnexa

Other: No free air or free fluid. No hernia or acute inflammatory
process.

Musculoskeletal: Osseous structures unremarkable.
IMPRESSION: Normal exam.

## 2019-11-15 ENCOUNTER — Emergency Department (HOSPITAL_COMMUNITY)
Admission: EM | Admit: 2019-11-15 | Discharge: 2019-11-16 | Discharge status: Against Medical Advice | Payer: Medicaid Other

## 2019-11-16 ENCOUNTER — Other Ambulatory Visit: Payer: Self-pay

## 2019-11-16 ENCOUNTER — Encounter (HOSPITAL_COMMUNITY): Payer: Self-pay

## 2019-11-16 ENCOUNTER — Ambulatory Visit (HOSPITAL_COMMUNITY)
Admission: EM | Admit: 2019-11-16 | Discharge: 2019-11-16 | Disposition: A | Discharge status: Home / Self Care | Payer: Medicaid Other | Attending: Family Medicine | Admitting: Family Medicine

## 2019-11-16 DIAGNOSIS — Z3201 Encounter for pregnancy test, result positive: Secondary | ICD-10-CM

## 2019-11-16 LAB — POCT PREGNANCY, URINE: Preg Test, Ur: POSITIVE — AB

## 2019-11-16 MED ORDER — ONDANSETRON 4 MG PO TBDP: 4.0000 mg | ORAL_TABLET | Freq: Three times a day (TID) | ORAL | 1 refills | Status: DC | PRN

## 2019-11-16 MED ORDER — PRENATAL COMPLETE 14-0.4 MG PO TABS: 1.0000 | ORAL_TABLET | Freq: Every day | ORAL | 0 refills | Status: DC

## 2019-11-16 NOTE — ED Triage Notes (Signed)
Pt presents for possible pregnancy; pt states she is having some mild nausea.

## 2019-11-17 NOTE — ED Provider Notes (Signed)
[facility]   [id number] 11/16/19 Arrival Time: 1509  ASSESSMENT & PLAN:  1. Positive pregnancy test     Benign abdominal exam. No indications for urgent abdominal/pelvic imaging at this time. Discussed.  Meds ordered this encounter  Medications  . ondansetron (ZOFRAN-ODT) 4 MG disintegrating tablet    Sig: Take 1 tablet (4 mg total) by mouth every 8 (eight) hours as needed for nausea or vomiting.    Dispense:  30 tablet    Refill:  1  . Prenatal Vit-Fe Fumarate-FA (PRENATAL COMPLETE) 14-0.4 MG TABS    Sig: Take 1 tablet by mouth daily.    Dispense:  60 tablet    Refill:  0    Follow-up Information    Schedule an appointment as soon as possible for a visit  with [facility].   Specialty: Obstetrics and Gynecology Contact information: [address] [place], [place] [id number] [address] [phone]          Reviewed expectations re: course of current medical issues. Questions answered. Outlined signs and symptoms indicating need for more acute intervention. Patient verbalized understanding. After Visit Summary given.   SUBJECTIVE: History from: patient. Erica Vargas is a 23 y.o. female who requests a pregnancy test. Patient's last menstrual period was 10/02/2019. Is sexually active. "Feel like something's going on in my belly". No abdominal pain. Nausea present for several days; no emesis. Afebrile. No vaginal discharge or urinary symptoms. Ambulatory without assistance. Urinary symptoms: none. Bowel movements: have not significantly changed; last bowel movement within the past 1-2 days and without blood. No new medications.  Patient's last menstrual period was 10/02/2019. Past Surgical History:  Procedure Laterality Date  . NO PAST SURGERIES      ROS: As per HPI. All other systems negative.  OBJECTIVE:  P 82 RR 14  General appearance: alert, oriented, no acute distress  HEENT: [place]; AT; oropharynx moist Lungs: clear to auscultation bilaterally; unlabored respirations Heart: regular rate and rhythm Abdomen: soft; without distention; no specific tenderness to palpation; normal bowel sounds; without masses or organomegaly; without guarding or rebound tenderness Back: without CVA tenderness; FROM at waist Extremities: without LE edema; symmetrical; without gross deformities Skin: warm and dry Neurologic: normal gait Psychological: alert and cooperative; normal mood and affect  Labs: Results for orders placed or performed during the hospital encounter of 11/16/19  Pregnancy, urine POC  Result Value Ref Range   Preg Test, Ur POSITIVE (A) NEGATIVE   Labs Reviewed  POCT PREGNANCY, URINE - Abnormal; Notable for the following components:      Result Value   Preg Test, Ur POSITIVE (*)    All other components within normal limits  POC URINE PREG, ED    Allergies  Allergen Reactions  . Amoxicillin Other (See Comments)    Has patient had a PCN reaction causing immediate rash, facial/tongue/throat swelling, SOB or lightheadedness with hypotension:Yes Has patient had a PCN reaction causing severe rash involving mucus membranes or skin necrosis: Yes Has patient had a PCN reaction that required hospitalization: Already in the hospital  Has patient had a PCN reaction occurring within the last 10 years:Yes If all of the above answers are "NO", then may proceed with Cephalosporin use.    . Naproxen Hives  . Lidocaine Rash  Past Medical History:  Diagnosis Date  . Medical history non-contributory    Social History   Socioeconomic History  . Marital status: Single    Spouse name: Not on file  . Number of children: Not on file  . Years of education: Not on file  . Highest education level: Not on file  Occupational History  . Not on file  Social Needs  . Financial resource strain: Not on file  . Food  insecurity    Worry: Not on file    Inability: Not on file  . Transportation needs    Medical: Not on file    Non-medical: Not on file  Tobacco Use  . Smoking status: Former Smoker    Types: Cigars  . Smokeless tobacco: Never Used  Substance and Sexual Activity  . Alcohol use: No  . Drug use: No  . Sexual activity: Yes    Birth control/protection: None  Lifestyle  . Physical activity    Days per week: Not on file    Minutes per session: Not on file  . Stress: Not on file  Relationships  . Social Musician on phone: Not on file    Gets together: Not on file    Attends religious service: Not on file    Active member of club or organization: Not on file    Attends meetings of clubs or organizations: Not on file    Relationship status: Not on file  . Intimate partner violence    Fear of current or ex partner: Not on file    Emotionally abused: Not on file    Physically abused: Not on file    Forced sexual activity: Not on file  Other Topics Concern  . Not on file  Social History Narrative   ** Merged History Encounter **       Family History  Problem Relation Age of Onset  . Sickle cell anemia Mother   . Healthy Neg Hx      Mardella Layman, MD 11/17/19 [phone]

## 2022-01-31 ENCOUNTER — Ambulatory Visit (HOSPITAL_COMMUNITY)
Admission: EM | Admit: 2022-01-31 | Discharge: 2022-01-31 | Disposition: A | Discharge status: Home / Self Care | Payer: Medicaid Other | Attending: Family Medicine | Admitting: Family Medicine

## 2022-01-31 ENCOUNTER — Encounter (HOSPITAL_COMMUNITY): Payer: Self-pay

## 2022-01-31 ENCOUNTER — Other Ambulatory Visit: Payer: Self-pay

## 2022-01-31 DIAGNOSIS — M79602 Pain in left arm: Secondary | ICD-10-CM

## 2022-01-31 MED ORDER — LIDOCAINE-EPINEPHRINE-TETRACAINE (LET) TOPICAL GEL
3.0000 mL | Freq: Once | TOPICAL | Status: AC
  Administered 2022-01-31: 3 mL via TOPICAL

## 2022-01-31 MED ORDER — HYDROCODONE-ACETAMINOPHEN 5-325 MG PO TABS
1.0000 | ORAL_TABLET | Freq: Once | ORAL | Status: AC
  Administered 2022-01-31: 1 via ORAL

## 2022-01-31 MED ORDER — HYDROCODONE-ACETAMINOPHEN 5-325 MG PO TABS
ORAL_TABLET | ORAL | Status: AC
  Filled 2022-01-31: qty 1

## 2022-01-31 MED ORDER — HYDROCODONE-ACETAMINOPHEN 5-325 MG PO TABS: 1.0000 | ORAL_TABLET | Freq: Four times a day (QID) | ORAL | 0 refills | Status: AC | PRN

## 2022-01-31 MED ORDER — LIDOCAINE-EPINEPHRINE-TETRACAINE (LET) TOPICAL GEL
TOPICAL | Status: AC
  Filled 2022-01-31: qty 3
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
## 2022-01-31 NOTE — ED Triage Notes (Signed)
Pt states had surgery on her lt arm on 1/06. States need her sutures removed and wound checked.

## 2022-01-31 NOTE — Discharge Instructions (Signed)
